# Patient Record
Sex: Female | Born: 1970
Health system: Southern US, Community
[De-identification: ages and names within clinical notes are randomized; demographics above are authoritative.]

## PROBLEM LIST (undated history)

## (undated) ENCOUNTER — Emergency Department (HOSPITAL_COMMUNITY): Admission: EM | Payer: BC Managed Care – PPO | Source: Home / Self Care

## (undated) DIAGNOSIS — A4902 Methicillin resistant Staphylococcus aureus infection, unspecified site: Secondary | ICD-10-CM

## (undated) DIAGNOSIS — I517 Cardiomegaly: Secondary | ICD-10-CM

## (undated) DIAGNOSIS — J45909 Unspecified asthma, uncomplicated: Secondary | ICD-10-CM

## (undated) HISTORY — DX: Methicillin resistant Staphylococcus aureus infection, unspecified site: A49.02

## (undated) HISTORY — DX: Cardiomegaly: I51.7

---

## 2012-04-11 HISTORY — PX: ARTHROSCOPIC REPAIR ACL: SUR80

## 2013-08-09 ENCOUNTER — Other Ambulatory Visit: Payer: Self-pay | Admitting: Obstetrics and Gynecology

## 2013-08-09 ENCOUNTER — Ambulatory Visit (INDEPENDENT_AMBULATORY_CARE_PROVIDER_SITE_OTHER): Payer: BC Managed Care – PPO | Admitting: Obstetrics and Gynecology

## 2013-08-09 ENCOUNTER — Encounter: Payer: Self-pay | Admitting: Obstetrics and Gynecology

## 2013-08-09 VITALS — BP 110/60 | HR 64 | Ht 67.0 in | Wt 147.0 lb

## 2013-08-09 DIAGNOSIS — Z01419 Encounter for gynecological examination (general) (routine) without abnormal findings: Secondary | ICD-10-CM

## 2013-08-09 DIAGNOSIS — Z0000 Encounter for general adult medical examination without abnormal findings: Secondary | ICD-10-CM

## 2013-08-09 DIAGNOSIS — L680 Hirsutism: Secondary | ICD-10-CM

## 2013-08-09 LAB — COMPREHENSIVE METABOLIC PANEL
AST: 21 U/L (ref 0–37)
Albumin: 4.4 g/dL (ref 3.5–5.2)
Alkaline Phosphatase: 46 U/L (ref 39–117)
BUN: 12 mg/dL (ref 6–23)
Calcium: 9.4 mg/dL (ref 8.4–10.5)
Chloride: 103 mEq/L (ref 96–112)
Glucose, Bld: 84 mg/dL (ref 70–99)
Potassium: 4.4 mEq/L (ref 3.5–5.3)
Sodium: 140 mEq/L (ref 135–145)
Total Bilirubin: 0.7 mg/dL (ref 0.3–1.2)

## 2013-08-09 LAB — CBC
HCT: 42.1 % (ref 36.0–46.0)
MCH: 29.4 pg (ref 26.0–34.0)
MCHC: 34.7 g/dL (ref 30.0–36.0)
RDW: 13.8 % (ref 11.5–15.5)

## 2013-08-09 LAB — POCT URINALYSIS DIPSTICK
Bilirubin, UA: NEGATIVE
Blood, UA: NEGATIVE
Glucose, UA: NEGATIVE
Ketones, UA: NEGATIVE
pH, UA: 5

## 2013-08-09 LAB — LIPID PANEL
Cholesterol: 155 mg/dL (ref 0–200)
HDL: 49 mg/dL (ref >39)
LDL Cholesterol: 88 mg/dL (ref 0–99)
Triglycerides: 92 mg/dL (ref <150)
VLDL: 18 mg/dL (ref 0–40)
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
## 2013-08-09 NOTE — Patient Instructions (Addendum)

## 2013-08-10 LAB — TSH: TSH: 3.196 u[IU]/mL (ref 0.350–4.500)

## 2013-08-11 LAB — PROLACTIN: Prolactin: 7.7 ng/mL

## 2013-08-11 LAB — DHEA-SULFATE: DHEA-SO4: 144 ug/dL (ref 35–430)

## 2013-08-11 LAB — TESTOSTERONE: Testosterone: 49 ng/dL (ref 10–70)

## 2013-08-13 ENCOUNTER — Encounter: Payer: Self-pay | Admitting: Obstetrics and Gynecology

## 2013-09-16 ENCOUNTER — Other Ambulatory Visit: Payer: Self-pay

## 2013-10-27 ENCOUNTER — Telehealth: Payer: Self-pay | Admitting: Obstetrics and Gynecology

## 2013-10-27 DIAGNOSIS — Z30432 Encounter for removal of intrauterine contraceptive device: Secondary | ICD-10-CM

## 2013-10-27 NOTE — Telephone Encounter (Signed)
Pt wants to schedule an appointment to have her Mirena removed and also wants to discuss her mammogram with the nurse.

## 2013-10-27 NOTE — Telephone Encounter (Signed)
Returned call. States she did not have any questions for me r/t Mammogram, but only wanted appointment to see Dr. Edward Jolly for IUD removal. IUD removal scheduled for 12/13/13. Went over many dates, patient has limited availability, this is first date that worked for her.  IUD ordered, sent for precert.  Routing to provider for final review. Patient agreeable to disposition. Will close encounter

## 2013-12-13 ENCOUNTER — Encounter: Payer: Self-pay | Admitting: Obstetrics and Gynecology

## 2013-12-13 ENCOUNTER — Ambulatory Visit (INDEPENDENT_AMBULATORY_CARE_PROVIDER_SITE_OTHER): Payer: BC Managed Care – PPO | Admitting: Obstetrics and Gynecology

## 2013-12-13 VITALS — BP 109/71 | HR 71 | Resp 16 | Wt 142.0 lb

## 2013-12-13 DIAGNOSIS — Z30432 Encounter for removal of intrauterine contraceptive device: Secondary | ICD-10-CM

## 2013-12-13 DIAGNOSIS — L680 Hirsutism: Secondary | ICD-10-CM

## 2013-12-13 DIAGNOSIS — N6459 Other signs and symptoms in breast: Secondary | ICD-10-CM

## 2013-12-13 DIAGNOSIS — R922 Inconclusive mammogram: Secondary | ICD-10-CM

## 2013-12-13 NOTE — Progress Notes (Signed)
Patient ID: Pamela Berger, female   DOB: 1970/12/03, 43 y.o.   MRN: [id number]  Subjective  Patient is here for 4 issues. 1) Mirena IUD removal.  Not sexually active due to husband's health.   IUD expired.  May consider another Mirena IUD for the future for control of menses if needed. 2) Discussion of dense breast tissue. 3) New diagnosis of mother with stage three renal disease.  Maternal grandmother also had renal disease and diabetes.       Patient wants evaluation.  4) Will need 17 OHP drawn.  Had evaluation for hirsutism and all labs were negative.  Did not have 17 OHP drawn.   Objective  Procedure - IUD removal Verbal consent. Speculum placed in vagina.  Dressing forceps used to grasp strings.  IUD removed in entirety and without difficulty. No complications.  Minimal EBL.  Mammogram report review [facility] 1- heterogeneously dense breast tissue  Chart review of annual exam and labs - 08/09/13 BUN - 12 Cr - 0.77 Urine dip - no protein  Assessment  Successful Mirena IUD removal. No evidence of renal disease. Normal mammogram Hirsutism.    Plan  Monitor menses off Mirena IUD. Do yearly BMP and urine check. Patient will inquire regarding mother's renal disease diagnosis. I discussed benefits/risks of 3D mammograms. Check 17 OHP now.  15 minutes face to face time of which over 50% was spent in counseling.

## 2013-12-17 LAB — 17-HYDROXYPROGESTERONE: 17-OH-PROGESTERONE, LC/MS/MS: 26 ng/dL

## 2014-07-06 ENCOUNTER — Encounter: Payer: Self-pay | Admitting: Obstetrics and Gynecology

## 2014-08-10 ENCOUNTER — Ambulatory Visit: Payer: BC Managed Care – PPO | Admitting: Obstetrics and Gynecology

## 2014-08-10 ENCOUNTER — Encounter: Payer: Self-pay | Admitting: Obstetrics and Gynecology

## 2014-08-10 ENCOUNTER — Ambulatory Visit (INDEPENDENT_AMBULATORY_CARE_PROVIDER_SITE_OTHER): Payer: BC Managed Care – PPO | Admitting: Obstetrics and Gynecology

## 2014-08-10 VITALS — BP 120/76 | HR 60 | Resp 20 | Ht 67.0 in | Wt 144.4 lb

## 2014-08-10 DIAGNOSIS — Z01419 Encounter for gynecological examination (general) (routine) without abnormal findings: Secondary | ICD-10-CM

## 2014-08-10 DIAGNOSIS — Z0000 Encounter for general adult medical examination without abnormal findings: Secondary | ICD-10-CM

## 2014-08-10 LAB — CBC
HCT: 41.5 % (ref 36.0–46.0)
HEMOGLOBIN: 14.1 g/dL (ref 12.0–15.0)
MCH: 28.3 pg (ref 26.0–34.0)
MCHC: 34 g/dL (ref 30.0–36.0)
MCV: 83.3 fL (ref 78.0–100.0)
Platelets: 274 10*3/uL (ref 150–400)
RBC: 4.98 MIL/uL (ref 3.87–5.11)
RDW: 14 % (ref 11.5–15.5)
WBC: 6.1 10*3/uL (ref 4.0–10.5)

## 2014-08-10 LAB — POCT URINALYSIS DIPSTICK
BILIRUBIN UA: NEGATIVE
Blood, UA: NEGATIVE
GLUCOSE UA: NEGATIVE
Ketones, UA: NEGATIVE
Leukocytes, UA: NEGATIVE
NITRITE UA: NEGATIVE
Protein, UA: NEGATIVE
UROBILINOGEN UA: NEGATIVE
pH, UA: 5

## 2014-08-10 LAB — TSH: TSH: 2.363 u[IU]/mL (ref 0.350–4.500)

## 2014-08-10 LAB — COMPREHENSIVE METABOLIC PANEL
ALK PHOS: 48 U/L (ref 39–117)
ALT: 16 U/L (ref 0–35)
AST: 19 U/L (ref 0–37)
Albumin: 4.2 g/dL (ref 3.5–5.2)
BUN: 13 mg/dL (ref 6–23)
CO2: 24 mEq/L (ref 19–32)
Calcium: 9.4 mg/dL (ref 8.4–10.5)
Chloride: 104 mEq/L (ref 96–112)
Creat: 0.78 mg/dL (ref 0.50–1.10)
GLUCOSE: 77 mg/dL (ref 70–99)
Potassium: 4.5 mEq/L (ref 3.5–5.3)
Sodium: 141 mEq/L (ref 135–145)
Total Bilirubin: 0.5 mg/dL (ref 0.2–1.2)
Total Protein: 6.9 g/dL (ref 6.0–8.3)

## 2014-08-10 LAB — HEMOGLOBIN, FINGERSTICK: HEMOGLOBIN, FINGERSTICK: 14.6 g/dL (ref 12.0–16.0)

## 2014-08-10 LAB — LIPID PANEL
CHOLESTEROL: 164 mg/dL (ref 0–200)
HDL: 56 mg/dL (ref >39)
LDL Cholesterol: 97 mg/dL (ref 0–99)
Total CHOL/HDL Ratio: 2.9 Ratio
Triglycerides: 57 mg/dL (ref <150)
VLDL: 11 mg/dL (ref 0–40)
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
## 2014-08-10 NOTE — Patient Instructions (Signed)

## 2014-09-12 ENCOUNTER — Encounter: Payer: Self-pay | Admitting: Obstetrics and Gynecology

## 2015-08-18 ENCOUNTER — Ambulatory Visit: Payer: BC Managed Care – PPO | Admitting: Obstetrics and Gynecology

## 2015-08-18 ENCOUNTER — Telehealth: Payer: Self-pay | Admitting: Obstetrics and Gynecology

## 2015-08-18 NOTE — Telephone Encounter (Signed)
Patient did not keep AEX appointment today, recall entered, letter mailed to patient.

## 2015-08-18 NOTE — Telephone Encounter (Signed)
Thank you for the update!

## 2016-03-27 ENCOUNTER — Encounter: Payer: Self-pay | Admitting: Obstetrics and Gynecology

## 2019-12-27 DIAGNOSIS — R6884 Jaw pain: Secondary | ICD-10-CM | POA: Diagnosis not present

## 2019-12-27 DIAGNOSIS — M545 Low back pain: Secondary | ICD-10-CM | POA: Diagnosis not present

## 2019-12-27 DIAGNOSIS — R030 Elevated blood-pressure reading, without diagnosis of hypertension: Secondary | ICD-10-CM | POA: Diagnosis not present

## 2019-12-27 DIAGNOSIS — I517 Cardiomegaly: Secondary | ICD-10-CM | POA: Diagnosis not present

## 2019-12-27 DIAGNOSIS — Z13220 Encounter for screening for lipoid disorders: Secondary | ICD-10-CM | POA: Diagnosis not present

## 2019-12-27 DIAGNOSIS — Z131 Encounter for screening for diabetes mellitus: Secondary | ICD-10-CM | POA: Diagnosis not present

## 2019-12-30 ENCOUNTER — Encounter: Payer: Self-pay | Admitting: Cardiology

## 2019-12-30 ENCOUNTER — Ambulatory Visit: Payer: Self-pay | Admitting: Cardiology

## 2019-12-31 ENCOUNTER — Encounter: Payer: Self-pay | Admitting: Cardiology

## 2019-12-31 ENCOUNTER — Ambulatory Visit (INDEPENDENT_AMBULATORY_CARE_PROVIDER_SITE_OTHER): Payer: BLUE CROSS/BLUE SHIELD | Admitting: Cardiology

## 2019-12-31 VITALS — BP 124/84 | HR 69 | Ht 67.0 in | Wt 144.0 lb

## 2019-12-31 DIAGNOSIS — R9431 Abnormal electrocardiogram [ECG] [EKG]: Secondary | ICD-10-CM

## 2019-12-31 DIAGNOSIS — R030 Elevated blood-pressure reading, without diagnosis of hypertension: Secondary | ICD-10-CM

## 2019-12-31 DIAGNOSIS — E782 Mixed hyperlipidemia: Secondary | ICD-10-CM | POA: Diagnosis not present
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
## 2019-12-31 NOTE — Patient Instructions (Signed)
Medication Instructions:  Your physician recommends that you continue on your current medications as directed. Please refer to the Current Medication list given to you today.  *If you need a refill on your cardiac medications before your next appointment, please call your pharmacy*  Lab Work: None  If you have labs (blood work) drawn today and your tests are completely normal, you will receive your results only by: Marland Kitchen MyChart Message (if you have MyChart) OR . A paper copy in the mail If you have any lab test that is abnormal or we need to change your treatment, we will call you to review the results.  Testing/Procedures: Your physician has requested that you have an echocardiogram. Echocardiography is a painless test that uses sound waves to create images of your heart. It provides your doctor with information about the size and shape of your heart and how well your heart's chambers and valves are working. This procedure takes approximately one hour. There are no restrictions for this procedure.    Follow-Up: At [facility], you and your health needs are our priority.  As part of our continuing mission to provide you with exceptional heart care, we have created designated Provider Care Teams.  These Care Teams include your primary Cardiologist (physician) and Advanced Practice Providers (APPs -  Physician Assistants and Nurse Practitioners) who all work together to provide you with the care you need, when you need it.  Your next appointment:   1 month(s)  The format for your next appointment:   In Person  Provider:   Thomasene Ripple, DO  Other Instructions  Blood Pressure Record Sheet To take your blood pressure, you will need a blood pressure machine. You can buy a blood pressure machine (blood pressure monitor) at your clinic, drug store, or online. When choosing one, consider:  An automatic monitor that has an arm cuff.  A cuff that wraps snugly around your upper arm. You  should be able to fit only one finger between your arm and the cuff.  A device that stores blood pressure reading results.  Do not choose a monitor that measures your blood pressure from your wrist or finger. Follow your health care provider's instructions for how to take your blood pressure. To use this form:  Get one reading in the morning (a.m.) before you take any medicines.  Get one reading in the evening (p.m.) before supper.  Take at least 2 readings with each blood pressure check. This makes sure the results are correct. Wait 1-2 minutes between measurements.  Write down the results in the spaces on this form.  Repeat this once a week, or as told by your health care provider.  Make a follow-up appointment with your health care provider to discuss the results. Blood pressure log Date: _______________________  a.m. _____________________(1st reading) _____________________(2nd reading)  p.m. _____________________(1st reading) _____________________(2nd reading) Date: _______________________  a.m. _____________________(1st reading) _____________________(2nd reading)  p.m. _____________________(1st reading) _____________________(2nd reading) Date: _______________________  a.m. _____________________(1st reading) _____________________(2nd reading)  p.m. _____________________(1st reading) _____________________(2nd reading) Date: _______________________  a.m. _____________________(1st reading) _____________________(2nd reading)  p.m. _____________________(1st reading) _____________________(2nd reading) Date: _______________________  a.m. _____________________(1st reading) _____________________(2nd reading)  p.m. _____________________(1st reading) _____________________(2nd reading) This information is not intended to replace advice given to you by your health care provider. Make sure you discuss any questions you have with your health care provider. Document Revised:  12/26/2017 Document Reviewed: 10/28/2017 Elsevier Patient Education  2020 [employer].

## 2020-01-01 DIAGNOSIS — R9431 Abnormal electrocardiogram [ECG] [EKG]: Secondary | ICD-10-CM | POA: Insufficient documentation

## 2020-01-01 DIAGNOSIS — R030 Elevated blood-pressure reading, without diagnosis of hypertension: Secondary | ICD-10-CM | POA: Insufficient documentation

## 2020-01-01 DIAGNOSIS — E782 Mixed hyperlipidemia: Secondary | ICD-10-CM | POA: Insufficient documentation

## 2020-01-07 ENCOUNTER — Encounter: Payer: Self-pay | Admitting: Cardiology

## 2020-01-07 DIAGNOSIS — R9431 Abnormal electrocardiogram [ECG] [EKG]: Secondary | ICD-10-CM | POA: Diagnosis not present

## 2020-01-10 ENCOUNTER — Other Ambulatory Visit: Payer: Self-pay | Admitting: Family

## 2020-01-10 DIAGNOSIS — Z8249 Family history of ischemic heart disease and other diseases of the circulatory system: Secondary | ICD-10-CM

## 2020-01-10 DIAGNOSIS — R030 Elevated blood-pressure reading, without diagnosis of hypertension: Secondary | ICD-10-CM

## 2020-01-18 ENCOUNTER — Other Ambulatory Visit: Payer: BLUE CROSS/BLUE SHIELD

## 2020-01-20 DIAGNOSIS — Z20828 Contact with and (suspected) exposure to other viral communicable diseases: Secondary | ICD-10-CM | POA: Diagnosis not present

## 2020-01-27 DIAGNOSIS — I517 Cardiomegaly: Secondary | ICD-10-CM | POA: Insufficient documentation

## 2020-01-27 DIAGNOSIS — A4902 Methicillin resistant Staphylococcus aureus infection, unspecified site: Secondary | ICD-10-CM | POA: Insufficient documentation

## 2020-01-28 ENCOUNTER — Other Ambulatory Visit: Payer: Self-pay

## 2020-01-28 ENCOUNTER — Ambulatory Visit (INDEPENDENT_AMBULATORY_CARE_PROVIDER_SITE_OTHER): Payer: BLUE CROSS/BLUE SHIELD | Admitting: Cardiology

## 2020-01-28 ENCOUNTER — Encounter: Payer: Self-pay | Admitting: Cardiology

## 2020-01-28 VITALS — BP 110/76 | HR 59 | Ht 67.0 in | Wt 141.0 lb

## 2020-01-28 DIAGNOSIS — R9431 Abnormal electrocardiogram [ECG] [EKG]: Secondary | ICD-10-CM | POA: Diagnosis not present

## 2020-01-28 DIAGNOSIS — E782 Mixed hyperlipidemia: Secondary | ICD-10-CM

## 2020-01-28 DIAGNOSIS — R030 Elevated blood-pressure reading, without diagnosis of hypertension: Secondary | ICD-10-CM

## 2020-01-28 NOTE — Patient Instructions (Signed)
Medication Instructions:  Your physician recommends that you continue on your current medications as directed. Please refer to the Current Medication list given to you today.  *If you need a refill on your cardiac medications before your next appointment, please call your pharmacy*   Lab Work: -None  If you have labs (blood work) drawn today and your tests are completely normal, you will receive your results only by: . MyChart Message (if you have MyChart) OR . A paper copy in the mail If you have any lab test that is abnormal or we need to change your treatment, we will call you to review the results.   Testing/Procedures: -None   Follow-Up: At [facility], you and your health needs are our priority.  As part of our continuing mission to provide you with exceptional heart care, we have created designated Provider Care Teams.  These Care Teams include your primary Cardiologist (physician) and Advanced Practice Providers (APPs -  Physician Assistants and Nurse Practitioners) who all work together to provide you with the care you need, when you need it.  We recommend signing up for the patient portal called "MyChart".  Sign up information is provided on this After Visit Summary.  MyChart is used to connect with patients for Virtual Visits (Telemedicine).  Patients are able to view lab/test results, encounter notes, upcoming appointments, etc.  Non-urgent messages can be sent to your provider as well.   To learn more about what you can do with MyChart, go to [email or web address].    Your next appointment:   Follow up as needed.  
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
## 2020-07-06 DIAGNOSIS — Z1211 Encounter for screening for malignant neoplasm of colon: Secondary | ICD-10-CM | POA: Diagnosis not present

## 2020-07-06 DIAGNOSIS — Z1231 Encounter for screening mammogram for malignant neoplasm of breast: Secondary | ICD-10-CM | POA: Diagnosis not present

## 2020-07-06 DIAGNOSIS — M79671 Pain in right foot: Secondary | ICD-10-CM | POA: Diagnosis not present

## 2020-07-06 DIAGNOSIS — Z0001 Encounter for general adult medical examination with abnormal findings: Secondary | ICD-10-CM | POA: Diagnosis not present

## 2020-07-06 DIAGNOSIS — E7800 Pure hypercholesterolemia, unspecified: Secondary | ICD-10-CM | POA: Diagnosis not present

## 2020-07-06 DIAGNOSIS — R6884 Jaw pain: Secondary | ICD-10-CM | POA: Diagnosis not present

## 2020-07-06 DIAGNOSIS — J0100 Acute maxillary sinusitis, unspecified: Secondary | ICD-10-CM | POA: Diagnosis not present

## 2020-07-06 DIAGNOSIS — Z1331 Encounter for screening for depression: Secondary | ICD-10-CM | POA: Diagnosis not present

## 2020-10-16 DIAGNOSIS — Z20822 Contact with and (suspected) exposure to covid-19: Secondary | ICD-10-CM | POA: Diagnosis not present

## 2021-05-04 DIAGNOSIS — M9903 Segmental and somatic dysfunction of lumbar region: Secondary | ICD-10-CM | POA: Diagnosis not present

## 2021-05-04 DIAGNOSIS — M9905 Segmental and somatic dysfunction of pelvic region: Secondary | ICD-10-CM | POA: Diagnosis not present

## 2021-05-04 DIAGNOSIS — M5451 Vertebrogenic low back pain: Secondary | ICD-10-CM | POA: Diagnosis not present

## 2021-05-04 DIAGNOSIS — M9902 Segmental and somatic dysfunction of thoracic region: Secondary | ICD-10-CM | POA: Diagnosis not present

## 2021-05-07 DIAGNOSIS — M9905 Segmental and somatic dysfunction of pelvic region: Secondary | ICD-10-CM | POA: Diagnosis not present

## 2021-05-07 DIAGNOSIS — M9902 Segmental and somatic dysfunction of thoracic region: Secondary | ICD-10-CM | POA: Diagnosis not present

## 2021-05-07 DIAGNOSIS — M5451 Vertebrogenic low back pain: Secondary | ICD-10-CM | POA: Diagnosis not present

## 2021-05-07 DIAGNOSIS — M9903 Segmental and somatic dysfunction of lumbar region: Secondary | ICD-10-CM | POA: Diagnosis not present

## 2021-07-09 DIAGNOSIS — Z1272 Encounter for screening for malignant neoplasm of vagina: Secondary | ICD-10-CM | POA: Diagnosis not present

## 2021-07-09 DIAGNOSIS — Z1231 Encounter for screening mammogram for malignant neoplasm of breast: Secondary | ICD-10-CM | POA: Diagnosis not present

## 2021-07-09 DIAGNOSIS — E7800 Pure hypercholesterolemia, unspecified: Secondary | ICD-10-CM | POA: Diagnosis not present

## 2021-07-09 DIAGNOSIS — Z124 Encounter for screening for malignant neoplasm of cervix: Secondary | ICD-10-CM | POA: Diagnosis not present

## 2021-07-09 DIAGNOSIS — Z0000 Encounter for general adult medical examination without abnormal findings: Secondary | ICD-10-CM | POA: Diagnosis not present

## 2021-07-09 DIAGNOSIS — Z1331 Encounter for screening for depression: Secondary | ICD-10-CM | POA: Diagnosis not present

## 2021-07-18 DIAGNOSIS — H40013 Open angle with borderline findings, low risk, bilateral: Secondary | ICD-10-CM | POA: Diagnosis not present

## 2021-07-26 DIAGNOSIS — Z1231 Encounter for screening mammogram for malignant neoplasm of breast: Secondary | ICD-10-CM | POA: Diagnosis not present

## 2021-08-23 DIAGNOSIS — R922 Inconclusive mammogram: Secondary | ICD-10-CM | POA: Diagnosis not present

## 2021-08-23 DIAGNOSIS — R928 Other abnormal and inconclusive findings on diagnostic imaging of breast: Secondary | ICD-10-CM | POA: Diagnosis not present

## 2021-08-28 ENCOUNTER — Other Ambulatory Visit: Payer: Self-pay | Admitting: Family

## 2021-08-28 DIAGNOSIS — N6489 Other specified disorders of breast: Secondary | ICD-10-CM

## 2021-09-06 ENCOUNTER — Ambulatory Visit
Admission: RE | Admit: 2021-09-06 | Discharge: 2021-09-06 | Disposition: A | Discharge status: Home / Self Care | Payer: BC Managed Care – PPO | Source: Ambulatory Visit | Attending: Family | Admitting: Family

## 2021-09-06 ENCOUNTER — Other Ambulatory Visit: Payer: Self-pay

## 2021-09-06 DIAGNOSIS — N6489 Other specified disorders of breast: Secondary | ICD-10-CM

## 2021-09-06 DIAGNOSIS — R928 Other abnormal and inconclusive findings on diagnostic imaging of breast: Secondary | ICD-10-CM | POA: Diagnosis not present

## 2021-09-06 DIAGNOSIS — N6012 Diffuse cystic mastopathy of left breast: Secondary | ICD-10-CM | POA: Diagnosis not present

## 2021-09-20 ENCOUNTER — Ambulatory Visit: Payer: Self-pay | Admitting: Surgery

## 2021-09-20 DIAGNOSIS — N6322 Unspecified lump in the left breast, upper inner quadrant: Secondary | ICD-10-CM | POA: Diagnosis not present

## 2021-09-20 DIAGNOSIS — N6320 Unspecified lump in the left breast, unspecified quadrant: Secondary | ICD-10-CM

## 2021-09-20 NOTE — H&P (View-Only) (Signed)
Subjective   Chief Complaint: Left Breast Discordant Biopsy     History of Present Illness: Pamela Berger is a 50 y.o. female who is seen today as an office consultation at the request of Dr. Leia Alf for evaluation of Left Breast Discordant Biopsy .    This is a 50 year old female in good health who presents after recent screening mammogram revealed an area of distortion in the left upper inner quadrant.  She underwent stereotactic biopsy of this area that revealed apocrine metaplasia and fibrocystic changes.  The biopsy was felt to be discordant so she is referred to [place] to discuss lumpectomy.  No previous breast problems.  No family history of breast cancer.  She denies any symptoms in her breast prior to the mammogram.  She did develop a small hematoma after the biopsy.  Review of Systems: A complete review of systems was obtained from the patient.  I have reviewed this information and discussed as appropriate with the patient.  See HPI as well for other ROS.  Review of Systems  Constitutional: Negative.   HENT: Negative.   Eyes: Negative.   Respiratory: Negative.   Cardiovascular: Negative.   Gastrointestinal: Negative.   Genitourinary: Negative.   Musculoskeletal: Negative.   Skin: Negative.   Neurological: Negative.   Endo/Heme/Allergies: Negative.   Psychiatric/Behavioral: Negative.       Medical History: Past Medical History:  Diagnosis Date   Asthma, unspecified asthma severity, unspecified whether complicated, unspecified whether persistent     Patient Active Problem List  Diagnosis   Elevated BP without diagnosis of hypertension    Past Surgical History:  Procedure Laterality Date   ACL Replacement  2016     Allergies  Allergen Reactions   Codeine Itching   Penicillins Itching    No current outpatient medications on file prior to visit.   No current facility-administered medications on file prior to visit.    Family History  Problem Relation Age of  Onset   High blood pressure (Hypertension) Mother    Hyperlipidemia (Elevated cholesterol) Mother    Diabetes Father      Social History   Tobacco Use  Smoking Status Former   Types: Cigarettes   Quit date: 2008   Years since quitting: 14.8  Smokeless Tobacco Never     Social History   Socioeconomic History   Marital status: Married  Tobacco Use   Smoking status: Former    Types: Cigarettes    Quit date: 2008    Years since quitting: 14.8   Smokeless tobacco: Never  Substance and Sexual Activity   Alcohol use: Yes    Comment: 2 drinks per week   Drug use: Never    Objective:    Vitals:   09/20/21 1350  BP: 122/74  Pulse: 87  Temp: 36.7 C (98.1 F)  SpO2: 98%  Weight: 67.5 kg (148 lb 12.8 oz)  Height: 170.2 cm (5\' 7" )    Body mass index is 23.31 kg/m.  Physical Exam   Constitutional:  WDWN in NAD, conversant, no obvious deformities; lying in bed comfortably Eyes:  Pupils equal, round; sclera anicteric; moist conjunctiva; no lid lag HENT:  Oral mucosa moist; good dentition  Neck:  No masses palpated, trachea midline; no thyromegaly Lungs:  CTA bilaterally; normal respiratory effort Breasts:  symmetric, no nipple changes; no palpable masses or lymphadenopathy on either side; small resolving hematoma in the left upper inner quadrant CV:  Regular rate and rhythm; no murmurs; extremities well-perfused with no edema Abd:  +  bowel sounds, soft, non-tender, no palpable organomegaly; no palpable hernias Musc:  Unable to assess gait; no apparent clubbing or cyanosis in extremities Lymphatic:  No palpable cervical or axillary lymphadenopathy Skin:  Warm, dry; no sign of jaundice Psychiatric - alert and oriented x 4; calm mood and affect   Labs, Imaging and Diagnostic Testing: Diagnosis Breast, left, needle core biopsy, upper inner - USUAL DUCTAL HYPERPLASIA AND FIBROCYSTIC CHANGES WITH APOCRINE METAPLASIA - NO MALIGNANCY IDENTIFIED Microscopic Comment These  results were called to [facility] on September 07, 2021. Manning Charity MD Pathologist, Electronic Signature (Case signed 09/07/2021)  CLINICAL DATA:  49 year old female presenting for stereotactic biopsy of a left breast distortion.   EXAM: LEFT BREAST STEREOTACTIC CORE NEEDLE BIOPSY   COMPARISON:  Previous exams.   FINDINGS: The patient and I discussed the procedure of stereotactic-guided biopsy including benefits and alternatives. We discussed the high likelihood of a successful procedure. We discussed the risks of the procedure including infection, bleeding, tissue injury, clip migration, and inadequate sampling. Informed written consent was given. The usual time out protocol was performed immediately prior to the procedure.   Using sterile technique and 1% Lidocaine as local anesthetic, under stereotactic guidance, a 9 gauge vacuum assisted device was used to perform core needle biopsy of distortion in the upper inner left breast using a medial approach.   Lesion quadrant: Upper inner quadrant   At the conclusion of the procedure, a coil shaped shaped tissue marker clip was deployed into the biopsy cavity. Follow-up 2-view mammogram was performed and dictated separately.   IMPRESSION: Stereotactic-guided biopsy of distortion in the upper inner left breast. No apparent complications.   Electronically Signed: By: Frederico Hamman M.D. On: 09/06/2021 11:14  CLINICAL DATA:  Post biopsy mammogram of the left breast for clip placement.   EXAM: 3D DIAGNOSTIC LEFT MAMMOGRAM POST STEREOTACTIC BIOPSY   COMPARISON:  Previous exam(s).   FINDINGS: 3D Mammographic images were obtained following stereotactic guided biopsy of distortion in the upper inner left breast. The biopsy marking clip is about 5 mm anterior to the center of the distortion.   IMPRESSION: The coil shaped biopsy marking clip is approximately 5 mm anterior to the center of the  distortion in the upper inner left breast.   Final Assessment: Post Procedure Mammograms for Marker Placement     Electronically Signed   By: Frederico Hamman M.D.   On: 09/06/2021 11:20   Assessment and Plan:  Diagnoses and all orders for this visit:  Mass of upper inner quadrant of left breast    Left radioactive seed localized lumpectomy.  The surgical procedure has been discussed with the patient.  Potential risks, benefits, alternative treatments, and expected outcomes have been explained.  All of the patient's questions at this time have been answered.  The likelihood of reaching the patient's treatment goal is good.  The patient understand the proposed surgical procedure and wishes to proceed.    Daesean Lazarz Delbert Harness, MD  09/20/2021 3:13 PM

## 2021-09-20 NOTE — H&P (Signed)
Subjective   Chief Complaint: Left Breast Discordant Biopsy     History of Present Illness: Pamela Berger is a 50 y.o. female who is seen today as an office consultation at the request of Dr. Leia Alf for evaluation of Left Breast Discordant Biopsy .    This is a 50 year old female in good health who presents after recent screening mammogram revealed an area of distortion in the left upper inner quadrant.  She underwent stereotactic biopsy of this area that revealed apocrine metaplasia and fibrocystic changes.  The biopsy was felt to be discordant so she is referred to [place] to discuss lumpectomy.  No previous breast problems.  No family history of breast cancer.  She denies any symptoms in her breast prior to the mammogram.  She did develop a small hematoma after the biopsy.  Review of Systems: A complete review of systems was obtained from the patient.  I have reviewed this information and discussed as appropriate with the patient.  See HPI as well for other ROS.  Review of Systems  Constitutional: Negative.   HENT: Negative.   Eyes: Negative.   Respiratory: Negative.   Cardiovascular: Negative.   Gastrointestinal: Negative.   Genitourinary: Negative.   Musculoskeletal: Negative.   Skin: Negative.   Neurological: Negative.   Endo/Heme/Allergies: Negative.   Psychiatric/Behavioral: Negative.       Medical History: Past Medical History:  Diagnosis Date   Asthma, unspecified asthma severity, unspecified whether complicated, unspecified whether persistent     Patient Active Problem List  Diagnosis   Elevated BP without diagnosis of hypertension    Past Surgical History:  Procedure Laterality Date   ACL Replacement  2016     Allergies  Allergen Reactions   Codeine Itching   Penicillins Itching    No current outpatient medications on file prior to visit.   No current facility-administered medications on file prior to visit.    Family History  Problem Relation Age of  Onset   High blood pressure (Hypertension) Mother    Hyperlipidemia (Elevated cholesterol) Mother    Diabetes Father      Social History   Tobacco Use  Smoking Status Former   Types: Cigarettes   Quit date: 2008   Years since quitting: 14.8  Smokeless Tobacco Never     Social History   Socioeconomic History   Marital status: Married  Tobacco Use   Smoking status: Former    Types: Cigarettes    Quit date: 2008    Years since quitting: 14.8   Smokeless tobacco: Never  Substance and Sexual Activity   Alcohol use: Yes    Comment: 2 drinks per week   Drug use: Never    Objective:    Vitals:   09/20/21 1350  BP: 122/74  Pulse: 87  Temp: 36.7 C (98.1 F)  SpO2: 98%  Weight: 67.5 kg (148 lb 12.8 oz)  Height: 170.2 cm (5\' 7" )    Body mass index is 23.31 kg/m.  Physical Exam   Constitutional:  WDWN in NAD, conversant, no obvious deformities; lying in bed comfortably Eyes:  Pupils equal, round; sclera anicteric; moist conjunctiva; no lid lag HENT:  Oral mucosa moist; good dentition  Neck:  No masses palpated, trachea midline; no thyromegaly Lungs:  CTA bilaterally; normal respiratory effort Breasts:  symmetric, no nipple changes; no palpable masses or lymphadenopathy on either side; small resolving hematoma in the left upper inner quadrant CV:  Regular rate and rhythm; no murmurs; extremities well-perfused with no edema Abd:  +  bowel sounds, soft, non-tender, no palpable organomegaly; no palpable hernias Musc:  Unable to assess gait; no apparent clubbing or cyanosis in extremities Lymphatic:  No palpable cervical or axillary lymphadenopathy Skin:  Warm, dry; no sign of jaundice Psychiatric - alert and oriented x 4; calm mood and affect   Labs, Imaging and Diagnostic Testing: Diagnosis Breast, left, needle core biopsy, upper inner - USUAL DUCTAL HYPERPLASIA AND FIBROCYSTIC CHANGES WITH APOCRINE METAPLASIA - NO MALIGNANCY IDENTIFIED Microscopic Comment These  results were called to [facility] on September 07, 2021. Manning Charity MD Pathologist, Electronic Signature (Case signed 09/07/2021)  CLINICAL DATA:  50 year old female presenting for stereotactic biopsy of a left breast distortion.   EXAM: LEFT BREAST STEREOTACTIC CORE NEEDLE BIOPSY   COMPARISON:  Previous exams.   FINDINGS: The patient and I discussed the procedure of stereotactic-guided biopsy including benefits and alternatives. We discussed the high likelihood of a successful procedure. We discussed the risks of the procedure including infection, bleeding, tissue injury, clip migration, and inadequate sampling. Informed written consent was given. The usual time out protocol was performed immediately prior to the procedure.   Using sterile technique and 1% Lidocaine as local anesthetic, under stereotactic guidance, a 9 gauge vacuum assisted device was used to perform core needle biopsy of distortion in the upper inner left breast using a medial approach.   Lesion quadrant: Upper inner quadrant   At the conclusion of the procedure, a coil shaped shaped tissue marker clip was deployed into the biopsy cavity. Follow-up 2-view mammogram was performed and dictated separately.   IMPRESSION: Stereotactic-guided biopsy of distortion in the upper inner left breast. No apparent complications.   Electronically Signed: By: Frederico Hamman M.D. On: 09/06/2021 11:14  CLINICAL DATA:  Post biopsy mammogram of the left breast for clip placement.   EXAM: 3D DIAGNOSTIC LEFT MAMMOGRAM POST STEREOTACTIC BIOPSY   COMPARISON:  Previous exam(s).   FINDINGS: 3D Mammographic images were obtained following stereotactic guided biopsy of distortion in the upper inner left breast. The biopsy marking clip is about 5 mm anterior to the center of the distortion.   IMPRESSION: The coil shaped biopsy marking clip is approximately 5 mm anterior to the center of the  distortion in the upper inner left breast.   Final Assessment: Post Procedure Mammograms for Marker Placement     Electronically Signed   By: Frederico Hamman M.D.   On: 09/06/2021 11:20   Assessment and Plan:  Diagnoses and all orders for this visit:  Mass of upper inner quadrant of left breast    Left radioactive seed localized lumpectomy.  The surgical procedure has been discussed with the patient.  Potential risks, benefits, alternative treatments, and expected outcomes have been explained.  All of the patient's questions at this time have been answered.  The likelihood of reaching the patient's treatment goal is good.  The patient understand the proposed surgical procedure and wishes to proceed.    Arial Galligan Delbert Harness, MD  09/20/2021 3:13 PM

## 2021-09-28 ENCOUNTER — Other Ambulatory Visit: Payer: Self-pay | Admitting: Surgery

## 2021-09-28 DIAGNOSIS — N6320 Unspecified lump in the left breast, unspecified quadrant: Secondary | ICD-10-CM

## 2021-10-09 ENCOUNTER — Encounter (HOSPITAL_BASED_OUTPATIENT_CLINIC_OR_DEPARTMENT_OTHER): Payer: Self-pay | Admitting: Surgery

## 2021-10-09 ENCOUNTER — Other Ambulatory Visit: Payer: Self-pay

## 2021-10-17 ENCOUNTER — Ambulatory Visit
Admission: RE | Admit: 2021-10-17 | Discharge: 2021-10-17 | Disposition: A | Discharge status: Home / Self Care | Payer: BC Managed Care – PPO | Source: Ambulatory Visit | Attending: Surgery | Admitting: Surgery

## 2021-10-17 DIAGNOSIS — N6320 Unspecified lump in the left breast, unspecified quadrant: Secondary | ICD-10-CM

## 2021-10-17 DIAGNOSIS — R928 Other abnormal and inconclusive findings on diagnostic imaging of breast: Secondary | ICD-10-CM | POA: Diagnosis not present

## 2021-10-17 MED ORDER — CHLORHEXIDINE GLUCONATE CLOTH 2 % EX PADS: 6.0000 | MEDICATED_PAD | Freq: Once | CUTANEOUS | Status: DC

## 2021-10-17 NOTE — Progress Notes (Signed)

## 2021-10-18 ENCOUNTER — Encounter (HOSPITAL_BASED_OUTPATIENT_CLINIC_OR_DEPARTMENT_OTHER): Payer: Self-pay | Admitting: Surgery

## 2021-10-18 ENCOUNTER — Ambulatory Visit (HOSPITAL_BASED_OUTPATIENT_CLINIC_OR_DEPARTMENT_OTHER)
Admission: RE | Admit: 2021-10-18 | Discharge: 2021-10-18 | Disposition: A | Discharge status: Home / Self Care | Payer: BC Managed Care – PPO | Source: Ambulatory Visit | Attending: Surgery | Admitting: Surgery

## 2021-10-18 ENCOUNTER — Ambulatory Visit (HOSPITAL_BASED_OUTPATIENT_CLINIC_OR_DEPARTMENT_OTHER): Payer: BC Managed Care – PPO | Admitting: Anesthesiology

## 2021-10-18 ENCOUNTER — Other Ambulatory Visit: Payer: Self-pay

## 2021-10-18 ENCOUNTER — Encounter (HOSPITAL_BASED_OUTPATIENT_CLINIC_OR_DEPARTMENT_OTHER)
Admission: RE | Disposition: A | Discharge status: Home / Self Care | Payer: Self-pay | Source: Ambulatory Visit | Attending: Surgery

## 2021-10-18 ENCOUNTER — Ambulatory Visit
Admission: RE | Admit: 2021-10-18 | Discharge: 2021-10-18 | Disposition: A | Discharge status: Home / Self Care | Payer: BC Managed Care – PPO | Source: Ambulatory Visit | Attending: Surgery | Admitting: Surgery

## 2021-10-18 DIAGNOSIS — N6489 Other specified disorders of breast: Secondary | ICD-10-CM | POA: Diagnosis not present

## 2021-10-18 DIAGNOSIS — N62 Hypertrophy of breast: Secondary | ICD-10-CM | POA: Diagnosis not present

## 2021-10-18 DIAGNOSIS — Z87891 Personal history of nicotine dependence: Secondary | ICD-10-CM | POA: Insufficient documentation

## 2021-10-18 DIAGNOSIS — R928 Other abnormal and inconclusive findings on diagnostic imaging of breast: Secondary | ICD-10-CM | POA: Diagnosis not present

## 2021-10-18 DIAGNOSIS — N63 Unspecified lump in unspecified breast: Secondary | ICD-10-CM | POA: Diagnosis not present

## 2021-10-18 DIAGNOSIS — N6320 Unspecified lump in the left breast, unspecified quadrant: Secondary | ICD-10-CM

## 2021-10-18 DIAGNOSIS — N6012 Diffuse cystic mastopathy of left breast: Secondary | ICD-10-CM | POA: Diagnosis not present

## 2021-10-18 DIAGNOSIS — N6082 Other benign mammary dysplasias of left breast: Secondary | ICD-10-CM | POA: Diagnosis not present

## 2021-10-18 HISTORY — DX: Unspecified asthma, uncomplicated: J45.909

## 2021-10-18 HISTORY — PX: BREAST LUMPECTOMY WITH RADIOACTIVE SEED LOCALIZATION: SHX6424

## 2021-10-18 LAB — POCT PREGNANCY, URINE: Preg Test, Ur: NEGATIVE

## 2021-10-18 SURGERY — BREAST LUMPECTOMY WITH RADIOACTIVE SEED LOCALIZATION
Anesthesia: General | Site: Breast | Laterality: Left

## 2021-10-18 MED ORDER — OXYCODONE HCL 5 MG PO TABS: 5.0000 mg | ORAL_TABLET | Freq: Once | ORAL | Status: DC | PRN

## 2021-10-18 MED ORDER — LIDOCAINE 2% (20 MG/ML) 5 ML SYRINGE
INTRAMUSCULAR | Status: DC | PRN
Start: 2021-10-18 — End: 2021-10-18
  Administered 2021-10-18: 100 mg via INTRAVENOUS

## 2021-10-18 MED ORDER — PROPOFOL 10 MG/ML IV BOLUS
INTRAVENOUS | Status: AC
  Filled 2021-10-18: qty 20

## 2021-10-18 MED ORDER — CEFAZOLIN SODIUM-DEXTROSE 2-4 GM/100ML-% IV SOLN
INTRAVENOUS | Status: AC
  Filled 2021-10-18: qty 100

## 2021-10-18 MED ORDER — CEFAZOLIN SODIUM-DEXTROSE 2-4 GM/100ML-% IV SOLN
2.0000 g | INTRAVENOUS | Status: AC
  Administered 2021-10-18: 2 g via INTRAVENOUS

## 2021-10-18 MED ORDER — ONDANSETRON HCL 4 MG/2ML IJ SOLN
INTRAMUSCULAR | Status: AC
  Filled 2021-10-18: qty 2

## 2021-10-18 MED ORDER — METOCLOPRAMIDE HCL 5 MG/ML IJ SOLN
10.0000 mg | Freq: Once | INTRAMUSCULAR | Status: AC
  Administered 2021-10-18: 10 mg via INTRAVENOUS

## 2021-10-18 MED ORDER — ACETAMINOPHEN 500 MG PO TABS
1000.0000 mg | ORAL_TABLET | ORAL | Status: AC
  Administered 2021-10-18: 1000 mg via ORAL

## 2021-10-18 MED ORDER — BUPIVACAINE-EPINEPHRINE 0.25% -1:200000 IJ SOLN
INTRAMUSCULAR | Status: DC | PRN
  Administered 2021-10-18: 10 mL

## 2021-10-18 MED ORDER — PHENYLEPHRINE 40 MCG/ML (10ML) SYRINGE FOR IV PUSH (FOR BLOOD PRESSURE SUPPORT)
PREFILLED_SYRINGE | INTRAVENOUS | Status: AC
  Filled 2021-10-18: qty 10

## 2021-10-18 MED ORDER — FENTANYL CITRATE (PF) 100 MCG/2ML IJ SOLN: 25.0000 ug | INTRAMUSCULAR | Status: DC | PRN

## 2021-10-18 MED ORDER — MIDAZOLAM HCL 5 MG/5ML IJ SOLN
INTRAMUSCULAR | Status: DC | PRN
  Administered 2021-10-18: 2 mg via INTRAVENOUS

## 2021-10-18 MED ORDER — DEXAMETHASONE SODIUM PHOSPHATE 10 MG/ML IJ SOLN
INTRAMUSCULAR | Status: AC
  Filled 2021-10-18: qty 1

## 2021-10-18 MED ORDER — FENTANYL CITRATE (PF) 100 MCG/2ML IJ SOLN
INTRAMUSCULAR | Status: AC
  Filled 2021-10-18: qty 2

## 2021-10-18 MED ORDER — ACETAMINOPHEN 500 MG PO TABS
ORAL_TABLET | ORAL | Status: AC
  Filled 2021-10-18: qty 2

## 2021-10-18 MED ORDER — FENTANYL CITRATE (PF) 100 MCG/2ML IJ SOLN
INTRAMUSCULAR | Status: DC | PRN
  Administered 2021-10-18: 25 ug via INTRAVENOUS
  Administered 2021-10-18: 100 ug via INTRAVENOUS

## 2021-10-18 MED ORDER — LIDOCAINE 2% (20 MG/ML) 5 ML SYRINGE
INTRAMUSCULAR | Status: AC
  Filled 2021-10-18: qty 5

## 2021-10-18 MED ORDER — PROPOFOL 10 MG/ML IV BOLUS
INTRAVENOUS | Status: DC | PRN
  Administered 2021-10-18: 150 mg via INTRAVENOUS
  Administered 2021-10-18: 50 mg via INTRAVENOUS

## 2021-10-18 MED ORDER — LACTATED RINGERS IV SOLN: INTRAVENOUS | Status: DC

## 2021-10-18 MED ORDER — PHENYLEPHRINE HCL (PRESSORS) 10 MG/ML IV SOLN
INTRAVENOUS | Status: DC | PRN
  Administered 2021-10-18 (×3): 80 ug via INTRAVENOUS

## 2021-10-18 MED ORDER — ONDANSETRON HCL 4 MG/2ML IJ SOLN: 4.0000 mg | Freq: Once | INTRAMUSCULAR | Status: DC | PRN

## 2021-10-18 MED ORDER — ONDANSETRON HCL 4 MG/2ML IJ SOLN
INTRAMUSCULAR | Status: DC | PRN
  Administered 2021-10-18: 4 mg via INTRAVENOUS

## 2021-10-18 MED ORDER — MIDAZOLAM HCL 2 MG/2ML IJ SOLN
INTRAMUSCULAR | Status: AC
  Filled 2021-10-18: qty 2

## 2021-10-18 MED ORDER — MEPERIDINE HCL 25 MG/ML IJ SOLN
6.2500 mg | INTRAMUSCULAR | Status: DC | PRN
Start: 2021-10-18 — End: 2021-10-18

## 2021-10-18 MED ORDER — DEXAMETHASONE SODIUM PHOSPHATE 4 MG/ML IJ SOLN
INTRAMUSCULAR | Status: DC | PRN
  Administered 2021-10-18: 8 mg via INTRAVENOUS

## 2021-10-18 MED ORDER — ACETAMINOPHEN 160 MG/5ML PO SOLN: 325.0000 mg | ORAL | Status: DC | PRN

## 2021-10-18 MED ORDER — ACETAMINOPHEN 325 MG PO TABS: 325.0000 mg | ORAL_TABLET | ORAL | Status: DC | PRN

## 2021-10-18 MED ORDER — OXYCODONE HCL 5 MG/5ML PO SOLN: 5.0000 mg | Freq: Once | ORAL | Status: DC | PRN

## 2021-10-18 MED ORDER — METOCLOPRAMIDE HCL 5 MG/ML IJ SOLN
INTRAMUSCULAR | Status: AC
  Filled 2021-10-18: qty 2

## 2021-10-18 SURGICAL SUPPLY — 44 items
APL PRP STRL LF DISP 70% ISPRP (MISCELLANEOUS) ×1
APL SKNCLS STERI-STRIP NONHPOA (GAUZE/BANDAGES/DRESSINGS) ×1
APPLIER CLIP 9.375 MED OPEN (MISCELLANEOUS) ×2
APR CLP MED 9.3 20 MLT OPN (MISCELLANEOUS) ×1
BENZOIN TINCTURE PRP APPL 2/3 (GAUZE/BANDAGES/DRESSINGS) ×2 IMPLANT
BLADE HEX COATED 2.75 (ELECTRODE) ×2 IMPLANT
BLADE SURG 15 STRL LF DISP TIS (BLADE) ×1 IMPLANT
BLADE SURG 15 STRL SS (BLADE) ×2
CANISTER SUC SOCK COL 7IN (MISCELLANEOUS) IMPLANT
CANISTER SUCT 1200ML W/VALVE (MISCELLANEOUS) IMPLANT
CHLORAPREP W/TINT 26 (MISCELLANEOUS) ×2 IMPLANT
CLIP APPLIE 9.375 MED OPEN (MISCELLANEOUS) ×1 IMPLANT
COVER BACK TABLE 60X90IN (DRAPES) ×2 IMPLANT
COVER MAYO STAND STRL (DRAPES) ×2 IMPLANT
COVER PROBE W GEL 5X96 (DRAPES) ×2 IMPLANT
DRAPE LAPAROTOMY 100X72 PEDS (DRAPES) ×2 IMPLANT
DRAPE UTILITY XL STRL (DRAPES) ×2 IMPLANT
DRSG TEGADERM 4X4.75 (GAUZE/BANDAGES/DRESSINGS) ×2 IMPLANT
ELECT REM PT RETURN 9FT ADLT (ELECTROSURGICAL) ×2
ELECTRODE REM PT RTRN 9FT ADLT (ELECTROSURGICAL) ×1 IMPLANT
GAUZE SPONGE 4X4 12PLY STRL LF (GAUZE/BANDAGES/DRESSINGS) ×2 IMPLANT
GLOVE SURG ENC MOIS LTX SZ7 (GLOVE) ×2 IMPLANT
GLOVE SURG UNDER POLY LF SZ7 (GLOVE) ×2 IMPLANT
GLOVE SURG UNDER POLY LF SZ7.5 (GLOVE) ×2 IMPLANT
GOWN STRL REUS W/ TWL LRG LVL3 (GOWN DISPOSABLE) ×2 IMPLANT
GOWN STRL REUS W/TWL LRG LVL3 (GOWN DISPOSABLE) ×4
KIT MARKER MARGIN INK (KITS) ×2 IMPLANT
NEEDLE HYPO 25X1 1.5 SAFETY (NEEDLE) ×2 IMPLANT
NS IRRIG 1000ML POUR BTL (IV SOLUTION) ×2 IMPLANT
PACK BASIN DAY SURGERY FS (CUSTOM PROCEDURE TRAY) ×2 IMPLANT
PENCIL SMOKE EVACUATOR (MISCELLANEOUS) ×2 IMPLANT
SLEEVE SCD COMPRESS KNEE MED (STOCKING) ×2 IMPLANT
SPONGE T-LAP 4X18 ~~LOC~~+RFID (SPONGE) ×2 IMPLANT
STRIP CLOSURE SKIN 1/2X4 (GAUZE/BANDAGES/DRESSINGS) ×2 IMPLANT
SUT MON AB 4-0 PC3 18 (SUTURE) ×2 IMPLANT
SUT SILK 2 0 SH (SUTURE) IMPLANT
SUT VIC AB 3-0 SH 27 (SUTURE) ×2
SUT VIC AB 3-0 SH 27X BRD (SUTURE) ×1 IMPLANT
SYR BULB EAR ULCER 3OZ GRN STR (SYRINGE) IMPLANT
SYR CONTROL 10ML LL (SYRINGE) ×2 IMPLANT
TOWEL GREEN STERILE FF (TOWEL DISPOSABLE) ×2 IMPLANT
TRAY FAXITRON CT DISP (TRAY / TRAY PROCEDURE) ×2 IMPLANT
TUBE CONNECTING 20X1/4 (TUBING) IMPLANT
YANKAUER SUCT BULB TIP NO VENT (SUCTIONS) IMPLANT

## 2021-10-18 NOTE — Op Note (Signed)
Pre-op Diagnosis:  Left breast mass Post-op Diagnosis: same Procedure:  Left radioactive seed localized lumpectomy Surgeon:  Jotham Ahn K. Anesthesia:  GEN - LMA Indications:  This is a 50 year old female in good health who presents after recent screening mammogram revealed an area of distortion in the left upper inner quadrant.  She underwent stereotactic biopsy of this area that revealed apocrine metaplasia and fibrocystic changes.  The biopsy was felt to be discordant so she is referred to [place] to discuss lumpectomy.  No previous breast problems.  No family history of breast cancer.  She denies any symptoms in her breast prior to the mammogram.   Description of procedure: The patient is brought to the operating room placed in supine position on the operating room table. After an adequate level of general anesthesia was obtained, her left breast was prepped with ChloraPrep and draped in sterile fashion. A timeout was taken to ensure the proper patient and proper procedure. We interrogated the breast with the neoprobe. We made a circumareolar incision around the upper medial side of the nipple after infiltrating with 0.25% Marcaine. Dissection was carried down in the breast tissue with cautery. We used the neoprobe to guide [place] towards the radioactive seed. We excised an area of tissue around the radioactive seed 1.5 cm in diameter. The specimen was removed and was oriented with a paint kit. Specimen mammogram showed the radioactive seed as well as the biopsy clip within the specimen. This was sent for pathologic examination. There is no residual radioactivity within the biopsy cavity. We inspected carefully for hemostasis. The wound was thoroughly irrigated. The wound was closed with a deep layer of 3-0 Vicryl and a subcuticular layer of 4-0 Monocryl. Benzoin Steri-Strips were applied. The patient was then extubated and brought to the recovery room in stable condition. All sponge, instrument, and needle  counts are correct.  Imogene Burn. Georgette Dover, MD, [facility]  General/ Trauma Surgery  10/18/2021 3:09 PM

## 2021-10-18 NOTE — Anesthesia Procedure Notes (Signed)
Procedure Name: LMA Insertion Date/Time: 10/18/2021 2:26 PM Performed by: Alford Highland, CRNA Pre-anesthesia Checklist: Patient identified, Emergency Drugs available, Suction available and Patient being monitored Patient Re-evaluated:Patient Re-evaluated prior to induction Oxygen Delivery Method: Circle System Utilized Preoxygenation: Pre-oxygenation with 100% oxygen Induction Type: IV induction Ventilation: Mask ventilation without difficulty LMA: LMA inserted LMA Size: 4.0 Number of attempts: 1 Airway Equipment and Method: Bite block Placement Confirmation: positive ETCO2 Tube secured with: Tape Dental Injury: Teeth and Oropharynx as per pre-operative assessment

## 2021-10-18 NOTE — Transfer of Care (Signed)
Immediate Anesthesia Transfer of Care Note  Patient: Pamela Berger  Procedure(s) Performed: LEFT BREAST LUMPECTOMY WITH RADIOACTIVE SEED LOCALIZATION (Left: Breast)  Patient Location: PACU  Anesthesia Type:General  Level of Consciousness: drowsy  Airway & Oxygen Therapy: Patient Spontanous Breathing and Patient connected to face mask oxygen  Post-op Assessment: Report given to RN and Post -op Vital signs reviewed and stable  Post vital signs: Reviewed and stable  Last Vitals:  Vitals Value Taken Time  BP    Temp    Pulse 71 10/18/21 1512  Resp 12 10/18/21 1512  SpO2 98 % 10/18/21 1512  Vitals shown include unvalidated device data.  Last Pain:  Vitals:   10/18/21 1318  TempSrc: Oral  PainSc: 0-No pain      Patients Stated Pain Goal: 4 (10/18/21 1318)  Complications: No notable events documented.

## 2021-10-18 NOTE — Discharge Instructions (Addendum)
[facility] Office Phone Number [phone]  BREAST BIOPSY/ PARTIAL MASTECTOMY: POST OP INSTRUCTIONS  Always review your discharge instruction sheet given to you by the facility where your surgery was performed.  IF YOU HAVE DISABILITY OR FAMILY LEAVE FORMS, YOU MUST BRING THEM TO THE OFFICE FOR PROCESSING.  DO NOT GIVE THEM TO YOUR DOCTOR.  A prescription for pain medication may be given to you upon discharge.  Take your pain medication as prescribed, if needed.  If narcotic pain medicine is not needed, then you may take acetaminophen (Tylenol) or ibuprofen (Advil) as needed. Take your usually prescribed medications unless otherwise directed If you need a refill on your pain medication, please contact your pharmacy.  They will contact our office to request authorization.  Prescriptions will not be filled after 5pm or on week-ends. You should eat very light the first 24 hours after surgery, such as soup, crackers, pudding, etc.  Resume your normal diet the day after surgery. Most patients will experience some swelling and bruising in the breast.  Ice packs and a good support bra will help.  Swelling and bruising can take several days to resolve.  It is common to experience some constipation if taking pain medication after surgery.  Increasing fluid intake and taking a stool softener will usually help or prevent this problem from occurring.  A mild laxative (Milk of Magnesia or Miralax) should be taken according to package directions if there are no bowel movements after 48 hours. Unless discharge instructions indicate otherwise, you may remove your bandages 24-48 hours after surgery, and you may shower at that time.  You may have steri-strips (small skin tapes) in place directly over the incision.  These strips should be left on the skin for 7-10 days.  If your surgeon used skin glue on the incision, you may shower in 24 hours.  The glue will flake off over the next 2-3 weeks.  Any  sutures or staples will be removed at the office during your follow-up visit. ACTIVITIES:  You may resume regular daily activities (gradually increasing) beginning the next day.  Wearing a good support bra or sports bra minimizes pain and swelling.  You may have sexual intercourse when it is comfortable. You may drive when you no longer are taking prescription pain medication, you can comfortably wear a seatbelt, and you can safely maneuver your car and apply brakes. RETURN TO WORK:  ______________________________________________________________________________________ Bonita Quin should see your doctor in the office for a follow-up appointment approximately two weeks after your surgery.  Your doctor's nurse will typically make your follow-up appointment when she calls you with your pathology report.  Expect your pathology report 2-3 business days after your surgery.  You may call to check if you do not hear from [place] after three days. OTHER INSTRUCTIONS: _______________________________________________________________________________________________ _____________________________________________________________________________________________________________________________________ _____________________________________________________________________________________________________________________________________ _____________________________________________________________________________________________________________________________________  WHEN TO CALL YOUR DOCTOR: Fever over 101.0 Nausea and/or vomiting. Extreme swelling or bruising. Continued bleeding from incision. Increased pain, redness, or drainage from the incision.  The clinic staff is available to answer your questions during regular business hours.  Please don't hesitate to call and ask to speak to one of the nurses for clinical concerns.  If you have a medical emergency, go to the nearest emergency room or call 911.  A surgeon from [facility] is always on call at the hospital.  For further questions, please visit [email or web address]    No Tylenol before 7:30pm if needed.   Post Anesthesia Home Care Instructions  Activity:  Get plenty of rest for the remainder of the day. A responsible individual must stay with you for 24 hours following the procedure.  For the next 24 hours, DO NOT: -Drive a car -Paediatric nurse -Drink alcoholic beverages -Take any medication unless instructed by your physician -Make any legal decisions or sign important papers.  Meals: Start with liquid foods such as gelatin or soup. Progress to regular foods as tolerated. Avoid greasy, spicy, heavy foods. If nausea and/or vomiting occur, drink only clear liquids until the nausea and/or vomiting subsides. Call your physician if vomiting continues.  Special Instructions/Symptoms: Your throat may feel dry or sore from the anesthesia or the breathing tube placed in your throat during surgery. If this causes discomfort, gargle with warm salt water. The discomfort should disappear within 24 hours.  If you had a scopolamine patch placed behind your ear for the management of post- operative nausea and/or vomiting:  1. The medication in the patch is effective for 72 hours, after which it should be removed.  Wrap patch in a tissue and discard in the trash. Wash hands thoroughly with soap and water. 2. You may remove the patch earlier than 72 hours if you experience unpleasant side effects which may include dry mouth, dizziness or visual disturbances. 3. Avoid touching the patch. Wash your hands with soap and water after contact with the patch.

## 2021-10-18 NOTE — Anesthesia Postprocedure Evaluation (Signed)
Anesthesia Post Note  Patient: Pamela Berger  Procedure(s) Performed: LEFT BREAST LUMPECTOMY WITH RADIOACTIVE SEED LOCALIZATION (Left: Breast)     Patient location during evaluation: PACU Anesthesia Type: General Level of consciousness: awake and alert and oriented Pain management: pain level controlled Vital Signs Assessment: post-procedure vital signs reviewed and stable Respiratory status: spontaneous breathing, nonlabored ventilation and respiratory function stable Cardiovascular status: blood pressure returned to baseline and stable Postop Assessment: no apparent nausea or vomiting Anesthetic complications: no   No notable events documented.  Last Vitals:  Vitals:   10/18/21 1545 10/18/21 1600  BP: 109/62 119/67  Pulse: 70 69  Resp: 12 14  Temp:    SpO2: 96% 98%    Last Pain:  Vitals:   10/18/21 1545  TempSrc:   PainSc: Asleep                 [name]

## 2021-10-18 NOTE — Anesthesia Preprocedure Evaluation (Addendum)
Anesthesia Evaluation  Patient identified by MRN, date of birth, ID band Patient awake    Reviewed: Allergy & Precautions, H&P , NPO status , Patient's Chart, lab work & pertinent test results, reviewed documented beta blocker date and time   Airway Mallampati: I  TM Distance: >3 FB Neck ROM: full    Dental no notable dental hx. (+) Teeth Intact, Dental Advisory Given, Poor Dentition, Missing,    Pulmonary neg pulmonary ROS, former smoker,    Pulmonary exam normal breath sounds clear to auscultation       Cardiovascular Exercise Tolerance: Good negative cardio ROS   Rhythm:regular Rate:Normal     Neuro/Psych negative neurological ROS  negative psych ROS   GI/Hepatic negative GI ROS, Neg liver ROS,   Endo/Other  negative endocrine ROS  Renal/GU negative Renal ROS  negative genitourinary   Musculoskeletal   Abdominal   Peds  Hematology negative hematology ROS (+)   Anesthesia Other Findings   Reproductive/Obstetrics negative OB ROS                            Anesthesia Physical Anesthesia Plan  ASA: 2  Anesthesia Plan: General   Post-op Pain Management:    Induction: Intravenous  PONV Risk Score and Plan: 3 and Ondansetron and Dexamethasone  Airway Management Planned: LMA and Oral ETT  Additional Equipment: None  Intra-op Plan:   Post-operative Plan: Extubation in OR  Informed Consent: I have reviewed the patients History and Physical, chart, labs and discussed the procedure including the risks, benefits and alternatives for the proposed anesthesia with the patient or authorized representative who has indicated his/her understanding and acceptance.     Dental Advisory Given  Plan Discussed with: CRNA and Anesthesiologist  Anesthesia Plan Comments: ( )        Anesthesia Quick Evaluation

## 2021-10-18 NOTE — Interval H&P Note (Signed)
History and Physical Interval Note:  10/18/2021 12:37 PM  Pamela Berger  has presented today for surgery, with the diagnosis of LEFT BREAST MASS.  The various methods of treatment have been discussed with the patient and family. After consideration of risks, benefits and other options for treatment, the patient has consented to  Procedure(s): LEFT BREAST LUMPECTOMY WITH RADIOACTIVE SEED LOCALIZATION (Left) as a surgical intervention.  The patient's history has been reviewed, patient examined, no change in status, stable for surgery.  I have reviewed the patient's chart and labs.  Questions were answered to the patient's satisfaction.     [name]

## 2021-10-19 ENCOUNTER — Encounter (HOSPITAL_BASED_OUTPATIENT_CLINIC_OR_DEPARTMENT_OTHER): Payer: Self-pay | Admitting: Surgery

## 2021-10-24 LAB — SURGICAL PATHOLOGY

## 2022-01-16 ENCOUNTER — Encounter (HOSPITAL_COMMUNITY): Payer: Self-pay

## 2022-04-11 DIAGNOSIS — Z1211 Encounter for screening for malignant neoplasm of colon: Secondary | ICD-10-CM | POA: Diagnosis not present

## 2022-04-11 DIAGNOSIS — Z1212 Encounter for screening for malignant neoplasm of rectum: Secondary | ICD-10-CM | POA: Diagnosis not present

## 2022-05-16 DIAGNOSIS — Z01818 Encounter for other preprocedural examination: Secondary | ICD-10-CM | POA: Diagnosis not present

## 2022-05-28 DIAGNOSIS — Z6823 Body mass index (BMI) 23.0-23.9, adult: Secondary | ICD-10-CM | POA: Diagnosis not present

## 2022-05-28 DIAGNOSIS — R062 Wheezing: Secondary | ICD-10-CM | POA: Diagnosis not present

## 2022-06-28 DIAGNOSIS — Z1211 Encounter for screening for malignant neoplasm of colon: Secondary | ICD-10-CM | POA: Diagnosis not present

## 2022-06-28 DIAGNOSIS — R195 Other fecal abnormalities: Secondary | ICD-10-CM | POA: Diagnosis not present

## 2022-06-28 DIAGNOSIS — Z87891 Personal history of nicotine dependence: Secondary | ICD-10-CM | POA: Diagnosis not present

## 2022-07-18 DIAGNOSIS — M549 Dorsalgia, unspecified: Secondary | ICD-10-CM | POA: Diagnosis not present

## 2022-07-18 DIAGNOSIS — Z6824 Body mass index (BMI) 24.0-24.9, adult: Secondary | ICD-10-CM | POA: Diagnosis not present

## 2022-07-18 DIAGNOSIS — Z0000 Encounter for general adult medical examination without abnormal findings: Secondary | ICD-10-CM | POA: Diagnosis not present

## 2022-07-18 DIAGNOSIS — M25562 Pain in left knee: Secondary | ICD-10-CM | POA: Diagnosis not present

## 2022-07-18 DIAGNOSIS — Z1331 Encounter for screening for depression: Secondary | ICD-10-CM | POA: Diagnosis not present

## 2022-07-18 DIAGNOSIS — M79601 Pain in right arm: Secondary | ICD-10-CM | POA: Diagnosis not present

## 2022-11-11 IMAGING — MG MM BREAST BX W LOC DEV 1ST LESION IMAGE BX SPEC STEREO GUIDE*L*
8 of 10 series · 8 of 22 positions shown · non-contrast
Comparison: Previous exams.
COMPARISON: Previous exams.

Addendum:
CLINICAL DATA: 50-year-old female presenting for stereotactic
biopsy of a left breast distortion.

EXAM:
LEFT BREAST STEREOTACTIC CORE NEEDLE BIOPSY

[L (1 of 6)]
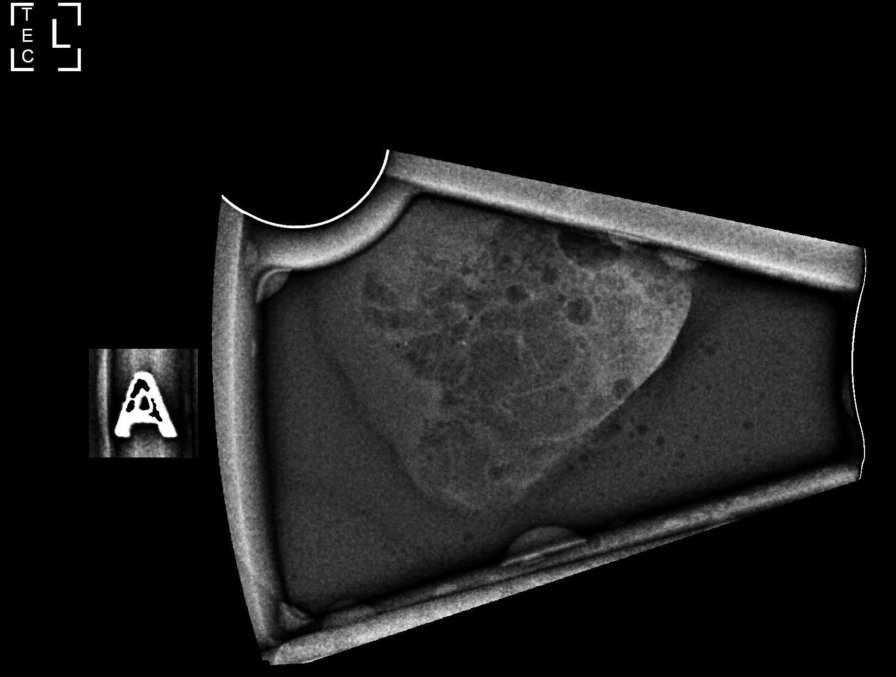

[L (2 of 6)]
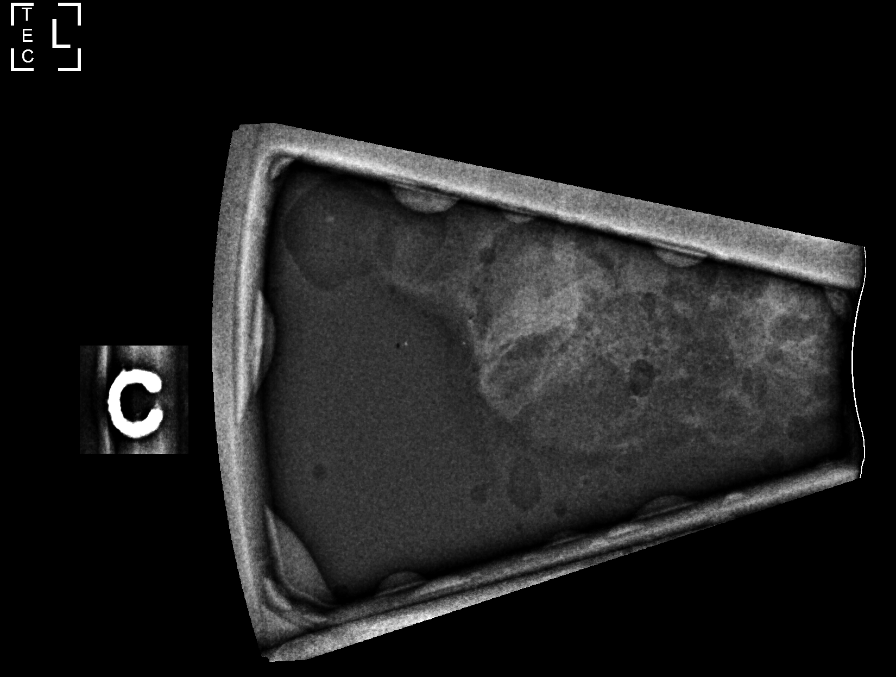

[L (3 of 6)]
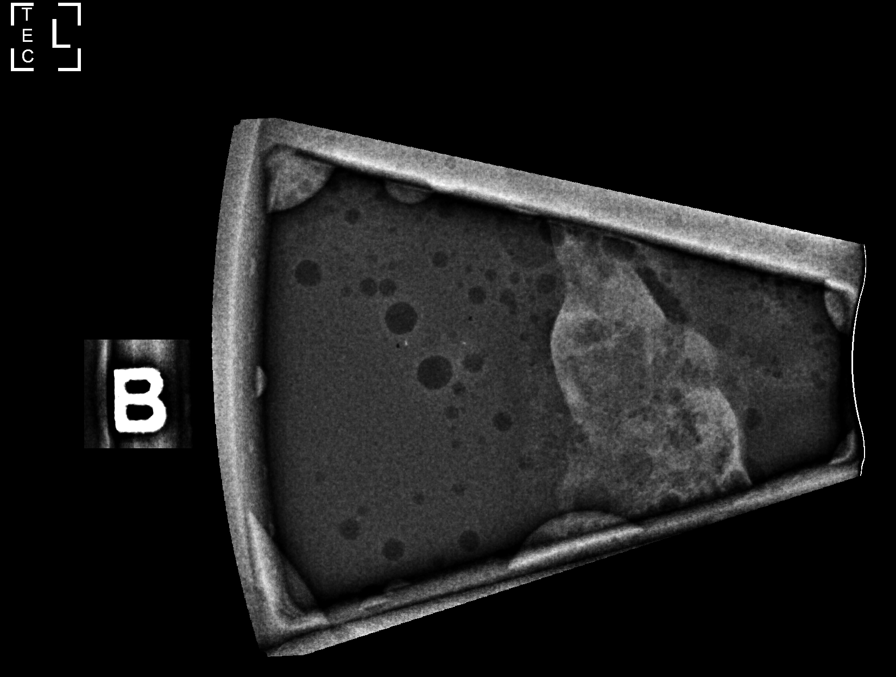

[L (4 of 6)]
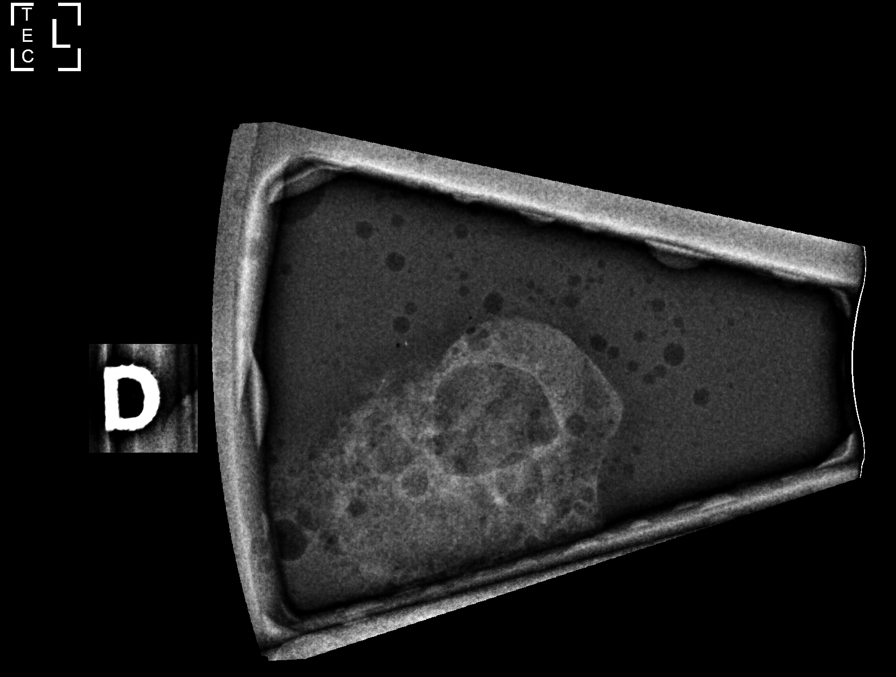

[L (5 of 6)]
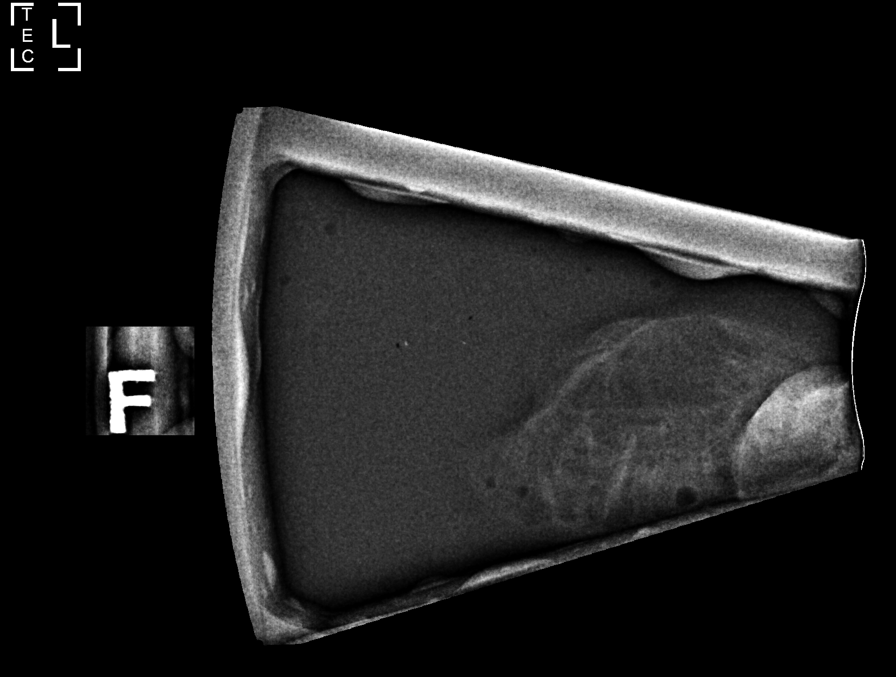

[L (6 of 6)]
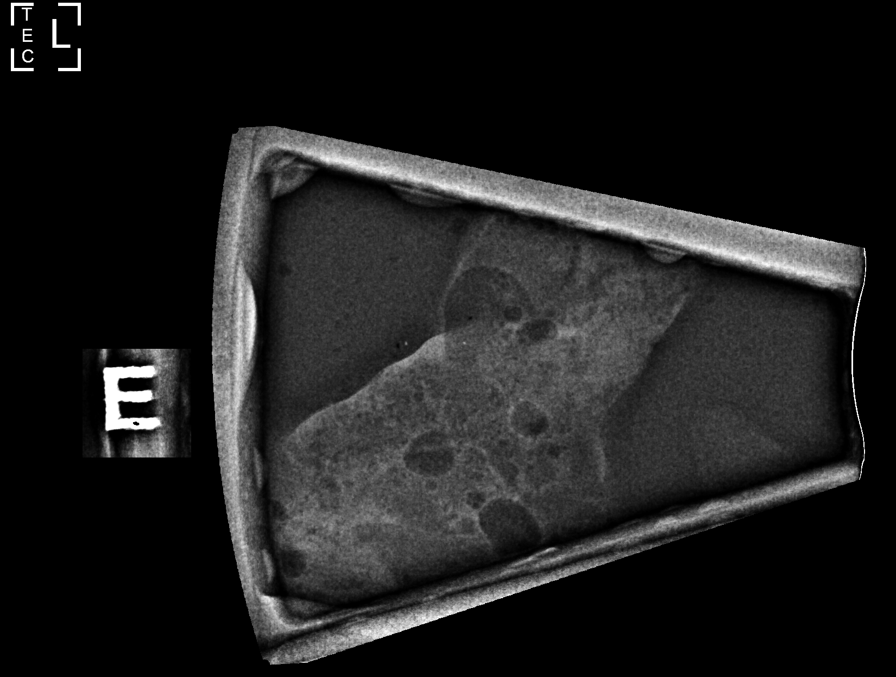

[L ML]
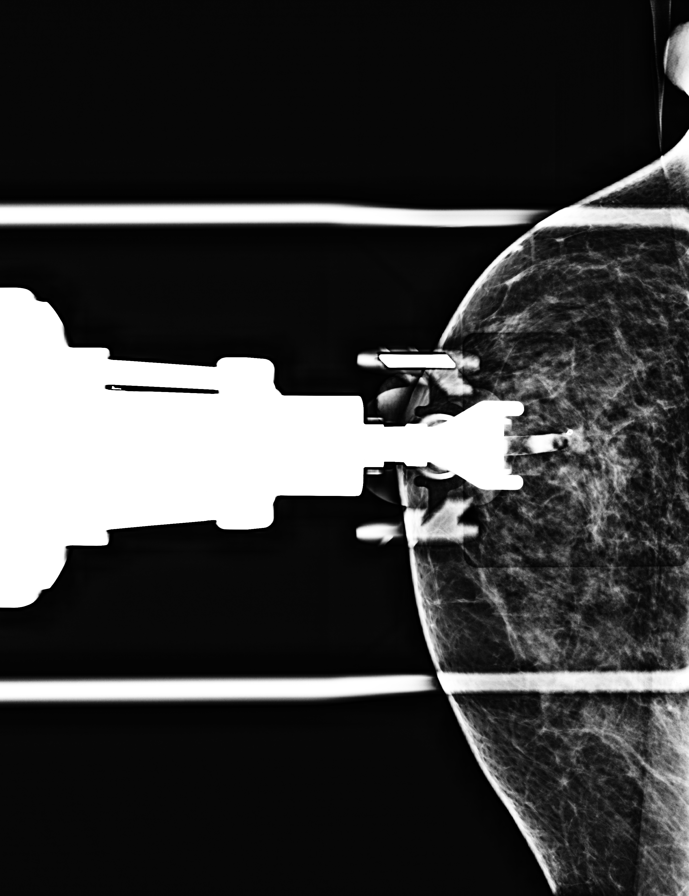

[L ML tomo · tomo slice 29/57.0]
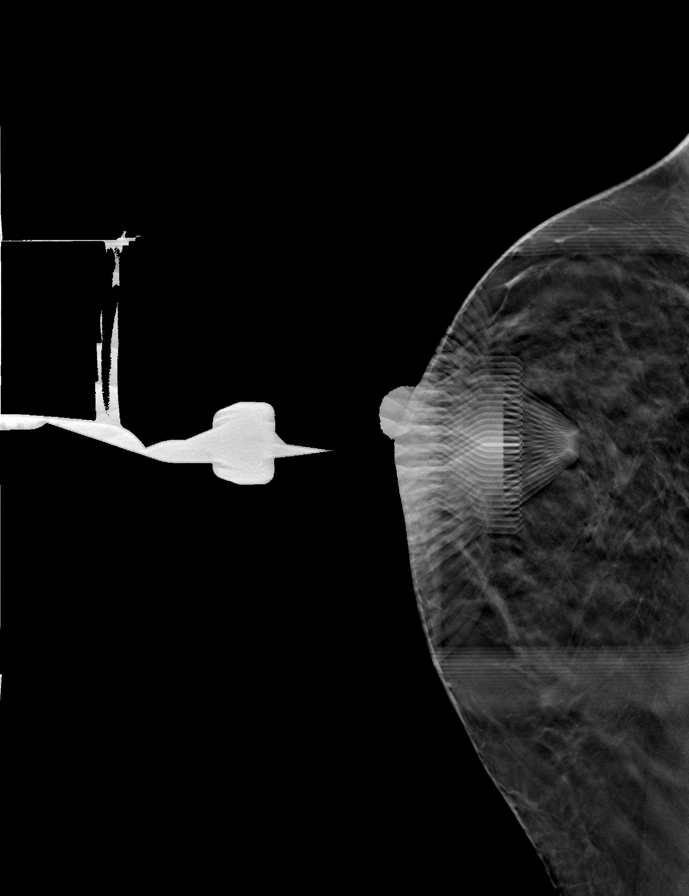

[8 of 22 positions shown; findings below may reference images not displayed]



Using sterile technique and 1% Lidocaine as local anesthetic, under
stereotactic guidance, a 9 gauge vacuum assisted device was used to
perform core needle biopsy of distortion in the upper inner left
breast using a medial approach.

Lesion quadrant: Upper inner quadrant

At the conclusion of the procedure, a coil shaped shaped tissue
marker clip was deployed into the biopsy cavity. Follow-up 2-view
mammogram was performed and dictated separately.
IMPRESSION: Stereotactic-guided biopsy of distortion in the upper inner left
breast. No apparent complications.

ADDENDUM:
Pathology revealed USUAL DUCTAL HYPERPLASIA AND FIBROCYSTIC CHANGES
WITH APOCRINE METAPLASIA of the LEFT breast, upper inner, (coil
clip). This was found to be discordant by Dr. Daisy Tiger, with
excision recommended.

Pathology results were discussed with the patient by telephone. The
patient reported doing well after the biopsy with minimal tenderness
at the site. Post biopsy instructions and care were reviewed and
questions were answered. The patient was encouraged to call The
direct phone number was provided.

Surgical consultation has been arranged with Dr. Burak Rudd at
[REDACTED] on September 20, 2021.

Pathology results reported by Paul Musa, RN on 09/07/2021.



Using sterile technique and 1% Lidocaine as local anesthetic, under
stereotactic guidance, a 9 gauge vacuum assisted device was used to
perform core needle biopsy of distortion in the upper inner left
breast using a medial approach.

Lesion quadrant: Upper inner quadrant

At the conclusion of the procedure, a coil shaped shaped tissue
marker clip was deployed into the biopsy cavity. Follow-up 2-view
mammogram was performed and dictated separately.
IMPRESSION: Stereotactic-guided biopsy of distortion in the upper inner left
breast. No apparent complications.

## 2023-05-22 DIAGNOSIS — Z9109 Other allergy status, other than to drugs and biological substances: Secondary | ICD-10-CM | POA: Diagnosis not present

## 2023-05-22 DIAGNOSIS — Z6825 Body mass index (BMI) 25.0-25.9, adult: Secondary | ICD-10-CM | POA: Diagnosis not present

## 2023-05-22 DIAGNOSIS — N951 Menopausal and female climacteric states: Secondary | ICD-10-CM | POA: Diagnosis not present

## 2023-05-22 DIAGNOSIS — J4520 Mild intermittent asthma, uncomplicated: Secondary | ICD-10-CM | POA: Diagnosis not present

## 2023-07-22 DIAGNOSIS — Z0000 Encounter for general adult medical examination without abnormal findings: Secondary | ICD-10-CM | POA: Diagnosis not present

## 2023-07-22 DIAGNOSIS — Z1231 Encounter for screening mammogram for malignant neoplasm of breast: Secondary | ICD-10-CM | POA: Diagnosis not present

## 2023-07-22 DIAGNOSIS — Z6824 Body mass index (BMI) 24.0-24.9, adult: Secondary | ICD-10-CM | POA: Diagnosis not present

## 2023-07-22 DIAGNOSIS — Z1331 Encounter for screening for depression: Secondary | ICD-10-CM | POA: Diagnosis not present

## 2023-07-22 DIAGNOSIS — E785 Hyperlipidemia, unspecified: Secondary | ICD-10-CM | POA: Diagnosis not present

## 2023-09-29 DIAGNOSIS — R778 Other specified abnormalities of plasma proteins: Secondary | ICD-10-CM | POA: Diagnosis not present

## 2023-10-06 DIAGNOSIS — R778 Other specified abnormalities of plasma proteins: Secondary | ICD-10-CM | POA: Diagnosis not present
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
## 2023-10-28 ENCOUNTER — Telehealth: Payer: Self-pay | Admitting: Oncology

## 2023-10-28 ENCOUNTER — Other Ambulatory Visit: Payer: BC Managed Care – PPO

## 2023-10-28 ENCOUNTER — Inpatient Hospital Stay (HOSPITAL_BASED_OUTPATIENT_CLINIC_OR_DEPARTMENT_OTHER): Payer: BC Managed Care – PPO | Admitting: Oncology

## 2023-10-28 ENCOUNTER — Inpatient Hospital Stay: Payer: BC Managed Care – PPO | Admitting: Oncology

## 2023-10-28 ENCOUNTER — Encounter: Payer: Self-pay | Admitting: Oncology

## 2023-10-28 ENCOUNTER — Inpatient Hospital Stay: Payer: BC Managed Care – PPO | Attending: Oncology

## 2023-10-28 VITALS — BP 129/67 | HR 70 | Temp 98.5°F | Resp 16 | Ht 67.0 in | Wt 154.7 lb

## 2023-10-28 DIAGNOSIS — D472 Monoclonal gammopathy: Secondary | ICD-10-CM | POA: Diagnosis not present

## 2023-10-28 DIAGNOSIS — E538 Deficiency of other specified B group vitamins: Secondary | ICD-10-CM | POA: Insufficient documentation

## 2023-10-28 DIAGNOSIS — R768 Other specified abnormal immunological findings in serum: Secondary | ICD-10-CM | POA: Insufficient documentation

## 2023-10-28 DIAGNOSIS — R779 Abnormality of plasma protein, unspecified: Secondary | ICD-10-CM | POA: Insufficient documentation

## 2023-10-28 LAB — CBC WITH DIFFERENTIAL (CANCER CENTER ONLY)
Abs Immature Granulocytes: 0 10*3/uL (ref 0.00–0.07)
Basophils Absolute: 0 10*3/uL (ref 0.0–0.1)
Basophils Relative: 1 %
Eosinophils Absolute: 0.1 10*3/uL (ref 0.0–0.5)
Eosinophils Relative: 1 %
HCT: 39.5 % (ref 36.0–46.0)
Hemoglobin: 13.4 g/dL (ref 12.0–15.0)
Immature Granulocytes: 0 %
Lymphocytes Relative: 25 %
Lymphs Abs: 1.2 10*3/uL (ref 0.7–4.0)
MCH: 29.1 pg (ref 26.0–34.0)
MCHC: 33.9 g/dL (ref 30.0–36.0)
MCV: 85.7 fL (ref 80.0–100.0)
Monocytes Absolute: 0.3 10*3/uL (ref 0.1–1.0)
Monocytes Relative: 5 %
Neutro Abs: 3.3 10*3/uL (ref 1.7–7.7)
Neutrophils Relative %: 68 %
Platelet Count: 301 10*3/uL (ref 150–400)
RBC: 4.61 MIL/uL (ref 3.87–5.11)
RDW: 13.3 % (ref 11.5–15.5)
WBC Count: 4.9 10*3/uL (ref 4.0–10.5)
nRBC: 0 % (ref 0.0–0.2)
nRBC: 0 /100{WBCs}

## 2023-10-28 LAB — BASIC METABOLIC PANEL - CANCER CENTER ONLY
Anion gap: 10 (ref 5–15)
BUN: 9 mg/dL (ref 6–20)
CO2: 27 mmol/L (ref 22–32)
Calcium: 9.5 mg/dL (ref 8.9–10.3)
Chloride: 102 mmol/L (ref 98–111)
Creatinine: 0.81 mg/dL (ref 0.44–1.00)
GFR, Estimated: >60 mL/min (ref >60)
Glucose, Bld: 108 mg/dL — ABNORMAL HIGH (ref 70–99)
Potassium: 4 mmol/L (ref 3.5–5.1)
Sodium: 138 mmol/L (ref 135–145)

## 2023-10-28 LAB — VITAMIN B12: Vitamin B-12: 175 pg/mL — ABNORMAL LOW (ref 180–914)

## 2023-10-28 LAB — IRON AND TIBC
Iron: 77 ug/dL (ref 28–170)
Saturation Ratios: 21 % (ref 10.4–31.8)
TIBC: 374 ug/dL (ref 250–450)
UIBC: 297 ug/dL

## 2023-10-28 LAB — FERRITIN: Ferritin: 34 ng/mL (ref 11–307)

## 2023-10-28 NOTE — Telephone Encounter (Signed)
10/28/23 Spoke with patient and confirmed next appt.

## 2023-10-29 LAB — KAPPA/LAMBDA LIGHT CHAINS
Kappa free light chain: 15.2 mg/L (ref 3.3–19.4)
Kappa, lambda light chain ratio: 1.54 (ref 0.26–1.65)
Lambda free light chains: 9.9 mg/L (ref 5.7–26.3)

## 2023-10-29 LAB — BETA 2 MICROGLOBULIN, SERUM: Beta-2 Microglobulin: 1.5 mg/L (ref 0.6–2.4)

## 2023-10-31 ENCOUNTER — Encounter: Payer: Self-pay | Admitting: Oncology

## 2023-11-07 LAB — IMMUNOFIXATION ELECTROPHORESIS
IgA: 76 mg/dL — ABNORMAL LOW (ref 87–352)
IgG (Immunoglobin G), Serum: 3470 mg/dL — ABNORMAL HIGH (ref 586–1602)
IgM (Immunoglobulin M), Srm: 54 mg/dL (ref 26–217)
Total Protein ELP: 9 g/dL — ABNORMAL HIGH (ref 6.0–8.5)

## 2023-11-09 LAB — PROTEIN ELECTROPHORESIS, SERUM
A/G Ratio: 0.9 (ref 0.7–1.7)
Albumin ELP: 4.1 g/dL (ref 2.9–4.4)
Alpha-1-Globulin: 0.2 g/dL (ref 0.0–0.4)
Alpha-2-Globulin: 0.7 g/dL (ref 0.4–1.0)
Beta Globulin: 0.9 g/dL (ref 0.7–1.3)
Gamma Globulin: 2.8 g/dL — ABNORMAL HIGH (ref 0.4–1.8)
Globulin, Total: 4.7 g/dL — ABNORMAL HIGH (ref 2.2–3.9)
M-Spike, %: 2.1 g/dL — ABNORMAL HIGH
Total Protein ELP: 8.8 g/dL — ABNORMAL HIGH (ref 6.0–8.5)

## 2023-11-10 DIAGNOSIS — E538 Deficiency of other specified B group vitamins: Secondary | ICD-10-CM | POA: Diagnosis not present

## 2023-11-10 DIAGNOSIS — R779 Abnormality of plasma protein, unspecified: Secondary | ICD-10-CM | POA: Diagnosis not present

## 2023-11-10 DIAGNOSIS — D472 Monoclonal gammopathy: Secondary | ICD-10-CM | POA: Diagnosis not present

## 2023-11-10 DIAGNOSIS — R768 Other specified abnormal immunological findings in serum: Secondary | ICD-10-CM | POA: Diagnosis not present

## 2023-11-10 NOTE — Progress Notes (Signed)
 [facility]  [address] [place],  [place]  [address] [phone]  Clinic Day:  11/11/2023  Referring physician: Jefferey Fitch, MD  HISTORY OF PRESENT ILLNESS:  The patient is a 52 y.o. female who our office recently began seeing for an elevated protein level.  She comes in today to go over her monoclonal test results.  If a plasma cell dyscrasia is present.  Since her last visit, the patient has been doing fairly well.  Of note, she does complain of lower back and hip pain.  PHYSICAL EXAM:  Blood pressure 127/77, pulse 74, temperature 98.2 F (36.8 C), temperature source Oral, resp. rate 16, height 5' 7 (1.702 m), weight 155 lb 9.6 oz (70.6 kg), SpO2 100%. Wt Readings from Last 3 Encounters:  11/11/23 155 lb 9.6 oz (70.6 kg)  10/28/23 154 lb 11.2 oz (70.2 kg)  10/18/21 152 lb 1.9 oz (69 kg)   Body mass index is 24.37 kg/m. Performance status (ECOG): 1 - Symptomatic but completely ambulatory Physical Exam Constitutional:      Appearance: Normal appearance. She is not ill-appearing.  HENT:     Mouth/Throat:     Mouth: Mucous membranes are moist.     Pharynx: Oropharynx is clear. No oropharyngeal exudate or posterior oropharyngeal erythema.  Cardiovascular:     Rate and Rhythm: Normal rate and regular rhythm.     Heart sounds: No murmur heard.    No friction rub. No gallop.  Pulmonary:     Effort: Pulmonary effort is normal. No respiratory distress.     Breath sounds: Normal breath sounds. No wheezing, rhonchi or rales.  Abdominal:     General: Bowel sounds are normal. There is no distension.     Palpations: Abdomen is soft. There is no mass.     Tenderness: There is no abdominal tenderness.  Musculoskeletal:        General: No swelling.     Right lower leg: No edema.     Left lower leg: No edema.  Lymphadenopathy:     Cervical: No cervical adenopathy.     Upper Body:     Right upper body: No supraclavicular or axillary  adenopathy.     Left upper body: No supraclavicular or axillary adenopathy.     Lower Body: No right inguinal adenopathy. No left inguinal adenopathy.  Skin:    General: Skin is warm.     Coloration: Skin is not jaundiced.     Findings: No lesion or rash.  Neurological:     General: No focal deficit present.     Mental Status: She is alert and oriented to person, place, and time. Mental status is at baseline.  Psychiatric:        Mood and Affect: Mood normal.        Behavior: Behavior normal.        Thought Content: Thought content normal.    LABS:      Latest Ref Rng & Units 10/28/2023   10:10 AM 08/10/2014    1:21 PM 08/09/2013   11:05 AM  CBC  WBC 4.0 - 10.5 K/uL 4.9  6.1  7.0   Hemoglobin 12.0 - 15.0 g/dL 86.5  85.3    85.8  85.8    14.6   Hematocrit 36.0 - 46.0 % 39.5  41.5  42.1   Platelets 150 - 400 K/uL 301  274  281       Latest Ref Rng & Units 10/28/2023  10:10 AM 08/10/2014    1:21 PM 08/09/2013   11:05 AM  CMP  Glucose 70 - 99 mg/dL 891  77  84   BUN 6 - 20 mg/dL 9  13  12    Creatinine 0.44 - 1.00 mg/dL 9.18  9.21  9.22   Sodium 135 - 145 mmol/L 138  141  140   Potassium 3.5 - 5.1 mmol/L 4.0  4.5  4.4   Chloride 98 - 111 mmol/L 102  104  103   CO2 22 - 32 mmol/L 27  24  28    Calcium 8.9 - 10.3 mg/dL 9.5  9.4  9.4   Total Protein 6.0 - 8.3 g/dL  6.9  7.0   Total Bilirubin 0.2 - 1.2 mg/dL  0.5  0.7   Alkaline Phos 39 - 117 U/L  48  46   AST 0 - 37 U/L  19  21   ALT 0 - 35 U/L  16  16     Latest Reference Range & Units 10/28/23 10:10  M-SPIKE, % Not Observed g/dL 2.1 (H)  (H): Data is abnormally high  Latest Reference Range & Units 10/28/23 10:11  IgG (Immunoglobin G), Serum 586 - 1,602 mg/dL 6,529 (H)  IgM (Immunoglobulin M), Srm 26 - 217 mg/dL 54  IgA 87 - 647 mg/dL 76 (L)  (H): Data is abnormally high (L): Data is abnormally low  Latest Reference Range & Units 10/28/23 10:10  Kappa free light chain 3.3 - 19.4 mg/L 15.2  Lambda free light chains  5.7 - 26.3 mg/L 9.9  Kappa, lambda light chain ratio 0.26 - 1.65  1.54    ASSESSMENT & PLAN:  Assessment/Plan:  A 52 y.o. female who our office recently began seeing with elevated protein levels.  In clinic today, I went over all of her recent labs with her, which unfortunately suggest a plasma cell dyscrasia, such as multiple myeloma, is present.  This is reflected by her very positive M spike and her elevated IgG level.  Based upon these results, the patient will undergo a bone marrow biopsy in the forthcoming days to rule out the possibility of multiple myeloma being present.  I will see her 2 weeks after her bone marrow biopsy is done to go over the results and their implications.  Although somewhat despondent, the patient understands all the plans discussed today and is in agreement with them.    Aavya Shafer DELENA Kerns, MD

## 2023-11-11 ENCOUNTER — Inpatient Hospital Stay: Payer: BC Managed Care – PPO

## 2023-11-11 ENCOUNTER — Inpatient Hospital Stay (HOSPITAL_BASED_OUTPATIENT_CLINIC_OR_DEPARTMENT_OTHER): Payer: BC Managed Care – PPO | Admitting: Oncology

## 2023-11-11 VITALS — BP 127/77 | HR 74 | Temp 98.2°F | Resp 16 | Ht 67.0 in | Wt 155.6 lb

## 2023-11-11 DIAGNOSIS — D472 Monoclonal gammopathy: Secondary | ICD-10-CM | POA: Insufficient documentation

## 2023-11-11 DIAGNOSIS — R779 Abnormality of plasma protein, unspecified: Secondary | ICD-10-CM

## 2023-11-11 DIAGNOSIS — E538 Deficiency of other specified B group vitamins: Secondary | ICD-10-CM | POA: Diagnosis not present

## 2023-11-11 DIAGNOSIS — R768 Other specified abnormal immunological findings in serum: Secondary | ICD-10-CM | POA: Diagnosis not present

## 2023-11-13 ENCOUNTER — Other Ambulatory Visit: Payer: Self-pay | Admitting: Oncology

## 2023-11-13 DIAGNOSIS — D472 Monoclonal gammopathy: Secondary | ICD-10-CM

## 2023-11-14 LAB — UPEP/UIFE/LIGHT CHAINS/TP, 24-HR UR
% BETA, Urine: 0 %
ALPHA 1 URINE: 0 %
Albumin, U: 100 %
Alpha 2, Urine: 0 %
Free Kappa Lt Chains,Ur: 5.72 mg/L (ref 1.17–86.46)
Free Kappa/Lambda Ratio: 5.15 (ref 1.83–14.26)
Free Lambda Lt Chains,Ur: 1.11 mg/L (ref 0.27–15.21)
GAMMA GLOBULIN URINE: 0 %
Total Protein, Urine-Ur/day: 139 mg/(24.h) (ref 30–150)
Total Protein, Urine: 6.3 mg/dL
Total Volume: 2200

## 2023-11-19 ENCOUNTER — Other Ambulatory Visit (HOSPITAL_COMMUNITY)
Admission: RE | Admit: 2023-11-19 | Discharge: 2023-11-19 | Disposition: A | Discharge status: Home / Self Care | Payer: BC Managed Care – PPO | Source: Ambulatory Visit | Attending: Oncology | Admitting: Oncology

## 2023-11-19 DIAGNOSIS — D472 Monoclonal gammopathy: Secondary | ICD-10-CM | POA: Diagnosis not present

## 2023-11-19 LAB — LAB REPORT - SCANNED: EGFR: 103

## 2023-11-21 LAB — SURGICAL PATHOLOGY

## 2023-11-27 ENCOUNTER — Encounter: Payer: Self-pay | Admitting: Oncology

## 2023-12-01 ENCOUNTER — Encounter (HOSPITAL_COMMUNITY): Payer: Self-pay | Admitting: Oncology

## 2023-12-02 ENCOUNTER — Inpatient Hospital Stay: Payer: BC Managed Care – PPO | Attending: Oncology | Admitting: Oncology

## 2023-12-02 ENCOUNTER — Other Ambulatory Visit: Payer: Self-pay | Admitting: Oncology

## 2023-12-02 VITALS — BP 124/66 | HR 80 | Temp 99.2°F | Resp 18 | Ht 67.0 in | Wt 156.1 lb

## 2023-12-02 DIAGNOSIS — D472 Monoclonal gammopathy: Secondary | ICD-10-CM

## 2023-12-02 DIAGNOSIS — R779 Abnormality of plasma protein, unspecified: Secondary | ICD-10-CM | POA: Insufficient documentation

## 2023-12-02 NOTE — Progress Notes (Signed)
[facility]  [address] [place],  [place]  [address] [phone]  Clinic Day:  12/02/2023  Referring physician: Philemon Kingdom, MD  HISTORY OF PRESENT ILLNESS:  The patient is a 53 y.o. female who comes in today to go over her bone marrow biopsy results.  Her bone marrow was done due to her having elevated monoclonal parameters that were suspicious for potential multiple myeloma.  Since her last visit, the patient has been doing fine.  She still notices lower back and hip pain, which raises the suspicion for multiple myeloma being present.    PHYSICAL EXAM:  Blood pressure 124/66, pulse 80, temperature 99.2 F (37.3 C), temperature source Oral, resp. rate 18, height 5\' 7"  (1.702 m), weight 156 lb 1.6 oz (70.8 kg), SpO2 100%. Wt Readings from Last 3 Encounters:  12/02/23 156 lb 1.6 oz (70.8 kg)  11/11/23 155 lb 9.6 oz (70.6 kg)  10/28/23 154 lb 11.2 oz (70.2 kg)   Body mass index is 24.45 kg/m. Performance status (ECOG): 1 - Symptomatic but completely ambulatory Physical Exam Constitutional:      Appearance: Normal appearance. She is not ill-appearing.  HENT:     Mouth/Throat:     Mouth: Mucous membranes are moist.     Pharynx: Oropharynx is clear. No oropharyngeal exudate or posterior oropharyngeal erythema.  Cardiovascular:     Rate and Rhythm: Normal rate and regular rhythm.     Heart sounds: No murmur heard.    No friction rub. No gallop.  Pulmonary:     Effort: Pulmonary effort is normal. No respiratory distress.     Breath sounds: Normal breath sounds. No wheezing, rhonchi or rales.  Abdominal:     General: Bowel sounds are normal. There is no distension.     Palpations: Abdomen is soft. There is no mass.     Tenderness: There is no abdominal tenderness.  Musculoskeletal:        General: No swelling.     Right lower leg: No edema.     Left lower leg: No edema.  Lymphadenopathy:     Cervical: No cervical adenopathy.      Upper Body:     Right upper body: No supraclavicular or axillary adenopathy.     Left upper body: No supraclavicular or axillary adenopathy.     Lower Body: No right inguinal adenopathy. No left inguinal adenopathy.  Skin:    General: Skin is warm.     Coloration: Skin is not jaundiced.     Findings: No lesion or rash.  Neurological:     General: No focal deficit present.     Mental Status: She is alert and oriented to person, place, and time. Mental status is at baseline.  Psychiatric:        Mood and Affect: Mood normal.        Behavior: Behavior normal.        Thought Content: Thought content normal.    PATHOLOGY: Her bone marrow biopsy revealed the following:  DIAGNOSIS:  BONE MARROW, ASPIRATE, CLOT, CORE: -  Kappa restricted plasma cell neoplasm involving approximate 10% of the overall marrow space -  Essentially absent histiocytic iron stores.  PERIPHERAL BLOOD: -  Overall unremarkable.  Note: It is noted the patient has an SPEP/IFE that identified a IgG monoclonal paraprotein with kappa light chain restriction within IgG of 3407 the milligrams per deciliter and an M protein of 2.1 g/dL.  The kappa/lambda free light chains are not elevated  and neither is the F lambda light chain ratio.  The beta-2 microglobulin is within normal limits.  UPEP/you IFE is also unremarkable.  The overall biopsy and clot section have extremely limited material for evaluation is considered suboptimal; however, kappa restricted plasma cells with aberrant CD56 and CD117 expression are identified.  The overall involvement on this limited specimen is interpreted as approximately 10%.  Clinical/radiologic/serologic correlation recommended.  MICROSCOPIC DESCRIPTION:  PERIPHERAL BLOOD SMEAR: The peripheral blood smear and indices are reviewed revealing essentially normal indices and morphology with no evidence of rouleaux or circulating plasma cells.  BONE MARROW ASPIRATE: The bone marrow  aspirate smear slides contain several cellular bone marrow spicules which are adequate for interpretation. Erythroid precursors: Erythroid series is present adequate number with orderly maturation and unremarkable morphology. Granulocytic precursors: Myeloid series is present in adequate number with orderly maturation and unremarkable morphology. Megakaryocytes: The megakaryocytes are present adequate number with unremarkable morphology. Lymphocytes/plasma cells: Plasma cells are mild to moderately increased at approximately 9% on a 500 cell count.  They are overall mature and normal in appearance.  Lymphocytes are not increased and are small round and mature.  TOUCH PREPARATIONS: Touch prep shows similar morphology to the aspirate smear slides, refer to above  CLOT AND BIOPSY: The decalcified bone marrow biopsy consists of a core of cortical and trabecular bone with extensive aspiration artifact and extremely limited crushed hematopoietic marrow for evaluation.  The hematopoietic marrow present for evaluation appears to have orderly trilineage hematopoiesis and without significant plasma cells identified on HE-stained slide.  This is overall suboptimal for evaluation  The clot section consistent predominantly periosteum, fragments of bone and extremely limited hematopoietic marrow with a cellularity difficult to assess given the scant amount (interpreted as 50 to 60%).  This is overall suboptimal for evaluation this limited material shows orderly trilineage hematopoiesis with a few scattered plasma cells.  IMMUNOHISTOCHEMICAL STAINS: CD138 and MUM1 highlight mildly increased plasma cells (approximately 10% on a very limited material and crush specimen.  Kappa/lambda ISH highlights a predominantly kappa positive plasma cells with only scattered lambda positive cells.  This interpretation is slightly limited given the crush nature of the specimen.  CD56 and CD117 appear to be  aberrantly expressed in these plasma cells.  Cyclin D1 appears negative in the plasma cells.  IRON STAIN: Iron stains are performed on a bone marrow aspirate or touch imprint smear and section of clot. The controls stained appropriately.       Storage Iron: Essentially absent histiocytic iron stores      Ring Sideroblasts: Not identified       LABS:      Latest Ref Rng & Units 10/28/2023   10:10 AM 08/10/2014    1:21 PM 08/09/2013   11:05 AM  CBC  WBC 4.0 - 10.5 K/uL 4.9  6.1  7.0   Hemoglobin 12.0 - 15.0 g/dL 91.4  78.2    95.6  21.3    14.6   Hematocrit 36.0 - 46.0 % 39.5  41.5  42.1   Platelets 150 - 400 K/uL 301  274  281       Latest Ref Rng & Units 10/28/2023   10:10 AM 08/10/2014    1:21 PM 08/09/2013   11:05 AM  CMP  Glucose 70 - 99 mg/dL 086  77  84   BUN 6 - 20 mg/dL 9  13  12    Creatinine 0.44 - 1.00 mg/dL 5.78  4.69  6.29   Sodium 135 -  145 mmol/L 138  141  140   Potassium 3.5 - 5.1 mmol/L 4.0  4.5  4.4   Chloride 98 - 111 mmol/L 102  104  103   CO2 22 - 32 mmol/L 27  24  28    Calcium 8.9 - 10.3 mg/dL 9.5  9.4  9.4   Total Protein 6.0 - 8.3 g/dL  6.9  7.0   Total Bilirubin 0.2 - 1.2 mg/dL  0.5  0.7   Alkaline Phos 39 - 117 U/L  48  46   AST 0 - 37 U/L  19  21   ALT 0 - 35 U/L  16  16     Latest Reference Range & Units 10/28/23 10:10  M-SPIKE, % Not Observed g/dL 2.1 (H)  (H): Data is abnormally high  Latest Reference Range & Units 10/28/23 10:11  IgG (Immunoglobin G), Serum 586 - 1,602 mg/dL 6,440 (H)  IgM (Immunoglobulin M), Srm 26 - 217 mg/dL 54  IgA 87 - 347 mg/dL 76 (L)  (H): Data is abnormally high (L): Data is abnormally low  Latest Reference Range & Units 10/28/23 10:10  Kappa free light chain 3.3 - 19.4 mg/L 15.2  Lambda free light chains 5.7 - 26.3 mg/L 9.9  Kappa, lambda light chain ratio 0.26 - 1.65  1.54    ASSESSMENT & PLAN  Assessment/Plan:  A 53 y.o. female whose labs with an underlying plasma cell dyscrasia.  I spoke with  hematopathology concerning her case, who felt that her bone marrow sample just was not adequate enough to give the best possible reading of her plasma cell percentage.  It has been recommended that her bone marrow be repeated.  I am comforted by the fact that her cytogenetics and myeloma FISH panel both came back normal.  Such studies are qualitative in nature, for which repeat studies are not necessary.  When factoring in these normal studies with her normal hemoglobin, renal function and calcium levels, this patient may have, at worst, smoldering multiple myeloma.  For completeness, I will perform her bone marrow on Monday, January 27th.  I will also have her undergo a skeletal survey to determine if lytic lesions are present, which, by itself, would qualify her as having active multiple myeloma.  I will see her back on February 10th to go over her bone marrow results, skeletal survey, and their implications.  The patient understands all the plans discussed today and is in agreement with them.    Onie Hayashi Kirby Funk, MD

## 2023-12-08 ENCOUNTER — Inpatient Hospital Stay: Payer: BC Managed Care – PPO

## 2023-12-08 ENCOUNTER — Other Ambulatory Visit: Payer: Self-pay | Admitting: Oncology

## 2023-12-08 ENCOUNTER — Other Ambulatory Visit: Payer: Self-pay

## 2023-12-08 ENCOUNTER — Inpatient Hospital Stay (HOSPITAL_BASED_OUTPATIENT_CLINIC_OR_DEPARTMENT_OTHER): Payer: BC Managed Care – PPO | Admitting: Oncology

## 2023-12-08 VITALS — BP 131/74 | HR 73 | Temp 98.4°F | Resp 14 | Ht 67.0 in | Wt 155.0 lb

## 2023-12-08 DIAGNOSIS — D704 Cyclic neutropenia: Secondary | ICD-10-CM | POA: Diagnosis not present

## 2023-12-08 DIAGNOSIS — R779 Abnormality of plasma protein, unspecified: Secondary | ICD-10-CM

## 2023-12-08 DIAGNOSIS — D472 Monoclonal gammopathy: Secondary | ICD-10-CM | POA: Diagnosis not present

## 2023-12-08 DIAGNOSIS — C9010 Plasma cell leukemia not having achieved remission: Secondary | ICD-10-CM | POA: Diagnosis not present

## 2023-12-08 LAB — CBC WITH DIFFERENTIAL (CANCER CENTER ONLY)
Abs Immature Granulocytes: 0.01 10*3/uL (ref 0.00–0.07)
Basophils Absolute: 0 10*3/uL (ref 0.0–0.1)
Basophils Relative: 1 %
Eosinophils Absolute: 0.2 10*3/uL (ref 0.0–0.5)
Eosinophils Relative: 4 %
HCT: 40 % (ref 36.0–46.0)
Hemoglobin: 13.6 g/dL (ref 12.0–15.0)
Immature Granulocytes: 0 %
Lymphocytes Relative: 39 %
Lymphs Abs: 2.1 10*3/uL (ref 0.7–4.0)
MCH: 29.1 pg (ref 26.0–34.0)
MCHC: 34 g/dL (ref 30.0–36.0)
MCV: 85.5 fL (ref 80.0–100.0)
Monocytes Absolute: 0.3 10*3/uL (ref 0.1–1.0)
Monocytes Relative: 5 %
Neutro Abs: 2.8 10*3/uL (ref 1.7–7.7)
Neutrophils Relative %: 51 %
Platelet Count: 264 10*3/uL (ref 150–400)
RBC: 4.68 MIL/uL (ref 3.87–5.11)
RDW: 12.8 % (ref 11.5–15.5)
WBC Count: 5.4 10*3/uL (ref 4.0–10.5)
nRBC: 0 % (ref 0.0–0.2)
nRBC: 0 /100{WBCs}

## 2023-12-08 MED ORDER — LIDOCAINE HCL (PF) 1 % IJ SOLN
INTRAMUSCULAR | Status: AC
  Filled 2023-12-08: qty 5

## 2023-12-08 NOTE — Progress Notes (Signed)
As the patient's first bone marrow biopsy per interventional radiology was inconclusive, a second bone marrow biopsy was done today to get a better plasma cell count to determine whether she has MGUS or multiple myeloma.  After consenting for the procedure, the patient's right posterior superior iliac crest was topically sterilized.  Afterwards, bone marrow core and aspirate were collected, which will be sent for further analysis.  There were no complications experienced with this procedure.

## 2023-12-10 LAB — SURGICAL PATHOLOGY

## 2023-12-18 ENCOUNTER — Ambulatory Visit (HOSPITAL_BASED_OUTPATIENT_CLINIC_OR_DEPARTMENT_OTHER)
Admission: RE | Admit: 2023-12-18 | Discharge: 2023-12-18 | Disposition: A | Discharge status: Home / Self Care | Payer: BC Managed Care – PPO | Source: Ambulatory Visit | Attending: Oncology | Admitting: Oncology

## 2023-12-18 DIAGNOSIS — D472 Monoclonal gammopathy: Secondary | ICD-10-CM

## 2023-12-18 DIAGNOSIS — C9000 Multiple myeloma not having achieved remission: Secondary | ICD-10-CM | POA: Diagnosis not present

## 2023-12-18 DIAGNOSIS — M47812 Spondylosis without myelopathy or radiculopathy, cervical region: Secondary | ICD-10-CM | POA: Diagnosis not present

## 2023-12-21 NOTE — Progress Notes (Signed)
[facility]  [address] [place],  [place]  [address] [phone]  Clinic Day:  12/22/2023  Referring physician: Philemon Kingdom, MD  HISTORY OF PRESENT ILLNESS:  The patient is a 53 y.o. female with a monoclonal paraproteinemia.  She comes in today to go over her second bone marrow biopsy, which was done to establish the exact percentage of plasma cells in her bone marrow.  She also comes in today to go over her skeletal survey results.  Since her last visit, the patient has been doing fairly well.  She still complains of lower back pain, which has been chronic in nature.  She denies having any new symptoms/findings which concern her for multiple myeloma overtly being present.  PHYSICAL EXAM:  Blood pressure 127/69, pulse (!) 59, temperature 98.1 F (36.7 C), temperature source Oral, resp. rate 14, height 5\' 7"  (1.702 m), weight 159 lb 3.2 oz (72.2 kg), SpO2 99%. Wt Readings from Last 3 Encounters:  12/22/23 159 lb 3.2 oz (72.2 kg)  12/08/23 155 lb (70.3 kg)  12/02/23 156 lb 1.6 oz (70.8 kg)   Body mass index is 24.93 kg/m. Performance status (ECOG): 1 - Symptomatic but completely ambulatory Physical Exam Constitutional:      Appearance: Normal appearance. She is not ill-appearing.  HENT:     Mouth/Throat:     Mouth: Mucous membranes are moist.     Pharynx: Oropharynx is clear. No oropharyngeal exudate or posterior oropharyngeal erythema.  Cardiovascular:     Rate and Rhythm: Normal rate and regular rhythm.     Heart sounds: No murmur heard.    No friction rub. No gallop.  Pulmonary:     Effort: Pulmonary effort is normal. No respiratory distress.     Breath sounds: Normal breath sounds. No wheezing, rhonchi or rales.  Abdominal:     General: Bowel sounds are normal. There is no distension.     Palpations: Abdomen is soft. There is no mass.     Tenderness: There is no abdominal tenderness.  Musculoskeletal:        General: No  swelling.     Right lower leg: No edema.     Left lower leg: No edema.  Lymphadenopathy:     Cervical: No cervical adenopathy.     Upper Body:     Right upper body: No supraclavicular or axillary adenopathy.     Left upper body: No supraclavicular or axillary adenopathy.     Lower Body: No right inguinal adenopathy. No left inguinal adenopathy.  Skin:    General: Skin is warm.     Coloration: Skin is not jaundiced.     Findings: No lesion or rash.  Neurological:     General: No focal deficit present.     Mental Status: She is alert and oriented to person, place, and time. Mental status is at baseline.  Psychiatric:        Mood and Affect: Mood normal.        Behavior: Behavior normal.        Thought Content: Thought content normal.    PATHOLOGY: Her bone marrow biopsy revealed the following: DIAGNOSIS:  BONE MARROW, ASPIRATE, CLOT, CORE: -Normocellular bone marrow with plasma cell neoplasm -See comment  PERIPHERAL BLOOD: -No significant morphologic abnormalities  COMMENT:  The bone marrow is generally normocellular for age with trilineage hematopoiesis.  In this background, the plasma cells are increased in number representing 9% of all cells with lack of large aggregates or  sheets and display kappa light chain restriction consistent with plasma cell neoplasm.  MICROSCOPIC DESCRIPTION:  PERIPHERAL BLOOD SMEAR: The red blood cells display mild anisopoikilocytosis with mild polychromasia.  The white blood cells are normal in number with no significant morphologic abnormalities.  The platelets are normal in number.  BONE MARROW ASPIRATE: Bone marrow particles present Erythroid precursors: Progressive maturation with only occasional late precursors displaying nuclear cytoplasmic dyssynchrony. Granulocytic precursors: Orderly and progressive maturation Megakaryocytes: Abundant with predominantly normal morphology Lymphocytes/plasma cells: The plasma cells are increased  in number representing 9% of all cells with lack of large aggregates or sheets. Many of the plasma cells display cytomegaly and/or small nucleoli. Large lymphoid aggregates are not seen.  TOUCH PREPARATIONS: A mixture of cell types but with relative abundance of plasma cells  CLOT AND BIOPSY: The clot sections are suboptimal with rare very minute bone marrow particles present.  The core biopsy is small but shows an estimated 40% cellularity with a mixture of cell types.  Large clusters or sheets of plasma cells are not identified.  Significant lymphoid aggregates are not seen.  Immunohistochemical stain for CD138 and in situ hybridization for kappa and lambda were performed on block C1 with appropriate controls.  CD138 highlights the plasma cell component consisting of interstitial cells and small clusters and displays kappa light chain restriction.  IRON STAIN: Iron stains are performed on a bone marrow aspirate or touch imprint smear and section of clot. The controls stained appropriately.       Storage Iron: Present      Ring Sideroblasts: Absent  ADDITIONAL DATA/TESTING: At the request of the clinician, the specimen will not be sent for cytogenetic analysis or FISH studies.      SCANS:  Her skeletal survey revealed the following: Narrative & Impression  CLINICAL DATA:  Back and hip pain, possible multiple myeloma   EXAM: METASTATIC BONE SURVEY- 21 views   COMPARISON:  None Available.   FINDINGS: Lower cervical spondylosis. Abdominal aortic atherosclerotic calcifications. Mild degenerative spurring of both hips and acetabula. Postoperative findings including ACL repair, left knee.   No definite lytic lesions to indicate myeloma.   IMPRESSION: 1. No definite lytic lesions to indicate myeloma. 2. Lower cervical spondylosis. 3. Mild degenerative spurring of both hips and acetabula. 4. Abdominal aortic atherosclerotic calcifications. 5. Postoperative findings  including ACL repair, left knee.   LABS:      Latest Ref Rng & Units 12/08/2023    8:48 AM 10/28/2023   10:10 AM 08/10/2014    1:21 PM  CBC  WBC 4.0 - 10.5 K/uL 5.4  4.9  6.1   Hemoglobin 12.0 - 15.0 g/dL 16.1  09.6  04.5    40.9   Hematocrit 36.0 - 46.0 % 40.0  39.5  41.5   Platelets 150 - 400 K/uL 264  301  274       Latest Ref Rng & Units 10/28/2023   10:10 AM 08/10/2014    1:21 PM 08/09/2013   11:05 AM  CMP  Glucose 70 - 99 mg/dL 811  77  84   BUN 6 - 20 mg/dL 9  13  12    Creatinine 0.44 - 1.00 mg/dL 9.14  7.82  9.56   Sodium 135 - 145 mmol/L 138  141  140   Potassium 3.5 - 5.1 mmol/L 4.0  4.5  4.4   Chloride 98 - 111 mmol/L 102  104  103   CO2 22 - 32 mmol/L 27  24  28  Calcium 8.9 - 10.3 mg/dL 9.5  9.4  9.4   Total Protein 6.0 - 8.3 g/dL  6.9  7.0   Total Bilirubin 0.2 - 1.2 mg/dL  0.5  0.7   Alkaline Phos 39 - 117 U/L  48  46   AST 0 - 37 U/L  19  21   ALT 0 - 35 U/L  16  16     Latest Reference Range & Units 10/28/23 10:10  M-SPIKE, % Not Observed g/dL 2.1 (H)  (H): Data is abnormally high  Latest Reference Range & Units 10/28/23 10:11  IgG (Immunoglobin G), Serum 586 - 1,602 mg/dL 4,098 (H)  IgM (Immunoglobulin M), Srm 26 - 217 mg/dL 54  IgA 87 - 119 mg/dL 76 (L)  (H): Data is abnormally high (L): Data is abnormally low  Latest Reference Range & Units 10/28/23 10:10  Kappa free light chain 3.3 - 19.4 mg/L 15.2  Lambda free light chains 5.7 - 26.3 mg/L 9.9  Kappa, lambda light chain ratio 0.26 - 1.65  1.54    ASSESSMENT & PLAN  Assessment/Plan:  A 53 y.o. female whose most recent bone marrow biopsy, which showed 9% plasma cells, essentially qualifies her for having a monoclonal gammopathy of unknown significance (MGUS).  The patient is well aware that a bone marrow showing >=10% plasma cells would essentially qualify her as having multiple myeloma.  To date, there has been no strong data suggesting early, preemptive therapy with MGUS has proven to be  beneficial.  I am also comforted by the fact that her cytogenetics and myeloma FISH panel from her 1st bone marrow did not show any adverse findings.  She already understands that her younger age, in conjunction with the current 9% plasma cell reading in her bone marrow, increases her chances of developing multiple myeloma at some point in her life.  The patient was also understandably pleased to find out that her skeletal survey did not show any lytic bone lesions.  For now, this patient's MGUS will be followed conservatively.  Her monoclonal parameters will be checked every 6 months; if there is ever a brisk rise in any of these labs, the patient understands a repeat bone marrow biopsy would be done to ensure she has not transformed to multiple myeloma.  I will see this patient back in August 2025 for repeat clinical assessment.  The patient understands all the plans discussed today and is in agreement with them.    Ermal Haberer Kirby Funk, MD

## 2023-12-22 ENCOUNTER — Inpatient Hospital Stay: Payer: BC Managed Care – PPO | Attending: Oncology | Admitting: Oncology

## 2023-12-22 ENCOUNTER — Other Ambulatory Visit: Payer: Self-pay | Admitting: Oncology

## 2023-12-22 VITALS — BP 127/69 | HR 59 | Temp 98.1°F | Resp 14 | Ht 67.0 in | Wt 159.2 lb

## 2023-12-22 DIAGNOSIS — D472 Monoclonal gammopathy: Secondary | ICD-10-CM | POA: Diagnosis not present

## 2024-01-26 DIAGNOSIS — M5432 Sciatica, left side: Secondary | ICD-10-CM | POA: Diagnosis not present

## 2024-01-26 DIAGNOSIS — M47816 Spondylosis without myelopathy or radiculopathy, lumbar region: Secondary | ICD-10-CM | POA: Diagnosis not present

## 2024-02-17 DIAGNOSIS — M5416 Radiculopathy, lumbar region: Secondary | ICD-10-CM | POA: Diagnosis not present

## 2024-02-17 DIAGNOSIS — M545 Low back pain, unspecified: Secondary | ICD-10-CM | POA: Diagnosis not present

## 2024-03-08 DIAGNOSIS — M545 Low back pain, unspecified: Secondary | ICD-10-CM | POA: Diagnosis not present

## 2024-03-08 DIAGNOSIS — M5416 Radiculopathy, lumbar region: Secondary | ICD-10-CM | POA: Diagnosis not present

## 2024-03-15 DIAGNOSIS — M545 Low back pain, unspecified: Secondary | ICD-10-CM | POA: Diagnosis not present

## 2024-03-15 DIAGNOSIS — M5416 Radiculopathy, lumbar region: Secondary | ICD-10-CM | POA: Diagnosis not present

## 2024-03-22 DIAGNOSIS — M5416 Radiculopathy, lumbar region: Secondary | ICD-10-CM | POA: Diagnosis not present

## 2024-03-22 DIAGNOSIS — M545 Low back pain, unspecified: Secondary | ICD-10-CM | POA: Diagnosis not present

## 2024-03-29 DIAGNOSIS — M5416 Radiculopathy, lumbar region: Secondary | ICD-10-CM | POA: Diagnosis not present

## 2024-03-29 DIAGNOSIS — M545 Low back pain, unspecified: Secondary | ICD-10-CM | POA: Diagnosis not present

## 2024-06-15 ENCOUNTER — Inpatient Hospital Stay: Payer: BC Managed Care – PPO | Attending: Oncology

## 2024-06-15 DIAGNOSIS — D472 Monoclonal gammopathy: Secondary | ICD-10-CM | POA: Insufficient documentation

## 2024-06-15 LAB — CMP (CANCER CENTER ONLY)
ALT: 39 U/L (ref 0–44)
AST: 32 U/L (ref 15–41)
Albumin: 4.5 g/dL (ref 3.5–5.0)
Alkaline Phosphatase: 63 U/L (ref 38–126)
Anion gap: 11 (ref 5–15)
BUN: 7 mg/dL (ref 6–20)
CO2: 24 mmol/L (ref 22–32)
Calcium: 9.6 mg/dL (ref 8.9–10.3)
Chloride: 104 mmol/L (ref 98–111)
Creatinine: 0.8 mg/dL (ref 0.44–1.00)
GFR, Estimated: >60 mL/min (ref >60)
Glucose, Bld: 110 mg/dL — ABNORMAL HIGH (ref 70–99)
Potassium: 4 mmol/L (ref 3.5–5.1)
Sodium: 139 mmol/L (ref 135–145)
Total Bilirubin: 0.6 mg/dL (ref 0.0–1.2)
Total Protein: 9.4 g/dL — ABNORMAL HIGH (ref 6.5–8.1)

## 2024-06-15 LAB — CBC WITH DIFFERENTIAL (CANCER CENTER ONLY)
Abs Immature Granulocytes: 0.01 K/uL (ref 0.00–0.07)
Basophils Absolute: 0 K/uL (ref 0.0–0.1)
Basophils Relative: 1 %
Eosinophils Absolute: 0.2 K/uL (ref 0.0–0.5)
Eosinophils Relative: 4 %
HCT: 40.5 % (ref 36.0–46.0)
Hemoglobin: 13.3 g/dL (ref 12.0–15.0)
Immature Granulocytes: 0 %
Lymphocytes Relative: 37 %
Lymphs Abs: 1.8 K/uL (ref 0.7–4.0)
MCH: 28.7 pg (ref 26.0–34.0)
MCHC: 32.8 g/dL (ref 30.0–36.0)
MCV: 87.5 fL (ref 80.0–100.0)
Monocytes Absolute: 0.3 K/uL (ref 0.1–1.0)
Monocytes Relative: 6 %
Neutro Abs: 2.4 K/uL (ref 1.7–7.7)
Neutrophils Relative %: 52 %
Platelet Count: 287 K/uL (ref 150–400)
RBC: 4.63 MIL/uL (ref 3.87–5.11)
RDW: 13.6 % (ref 11.5–15.5)
WBC Count: 4.7 K/uL (ref 4.0–10.5)
nRBC: 0 % (ref 0.0–0.2)

## 2024-06-16 LAB — KAPPA/LAMBDA LIGHT CHAINS
Kappa free light chain: 14.9 mg/L (ref 3.3–19.4)
Kappa, lambda light chain ratio: 1.6 (ref 0.26–1.65)
Lambda free light chains: 9.3 mg/L (ref 5.7–26.3)

## 2024-06-18 LAB — MULTIPLE MYELOMA PANEL, SERUM
Albumin SerPl Elph-Mcnc: 4.2 g/dL (ref 2.9–4.4)
Albumin/Glob SerPl: 0.9 (ref 0.7–1.7)
Alpha 1: 0.2 g/dL (ref 0.0–0.4)
Alpha2 Glob SerPl Elph-Mcnc: 0.7 g/dL (ref 0.4–1.0)
B-Globulin SerPl Elph-Mcnc: 1 g/dL (ref 0.7–1.3)
Gamma Glob SerPl Elph-Mcnc: 2.7 g/dL — ABNORMAL HIGH (ref 0.4–1.8)
Globulin, Total: 4.7 g/dL — ABNORMAL HIGH (ref 2.2–3.9)
IgA: 65 mg/dL — ABNORMAL LOW (ref 87–352)
IgG (Immunoglobin G), Serum: 3259 mg/dL — ABNORMAL HIGH (ref 586–1602)
IgM (Immunoglobulin M), Srm: 49 mg/dL (ref 26–217)
M Protein SerPl Elph-Mcnc: 2.3 g/dL — ABNORMAL HIGH
Total Protein ELP: 8.9 g/dL — ABNORMAL HIGH (ref 6.0–8.5)

## 2024-06-20 NOTE — Progress Notes (Unsigned)
 [facility] at [facility] [address] [place],  [place]  [address] [phone]  Clinic Day:  06/21/2024  Referring physician: Jefferey Fitch, MD  HISTORY OF PRESENT ILLNESS:  The patient is a 53 y.o. female with MGUS.  A second bone marrow biopsy done in February 2025 showed her with 9% plasma cells, which has her on the precipice of evolving into smoldering multiple myeloma. A skeletal survey earlier this year showed no lytic lesions.  She comes in today for routine follow-up.  Since her last visit, the patient has been doing fairly well.  She claims her lower back pain has gotten much better since she has being doing strength exercises.  She denies having any new symptoms/findings which concern her for multiple myeloma overtly being present.  PHYSICAL EXAM:  Blood pressure 128/69, pulse (!) 56, temperature 98.6 F (37 C), temperature source Oral, resp. rate 16, height 5' 7 (1.702 m), weight 157 lb 1.6 oz (71.3 kg), SpO2 100%. Wt Readings from Last 3 Encounters:  06/21/24 157 lb 1.6 oz (71.3 kg)  12/22/23 159 lb 3.2 oz (72.2 kg)  12/08/23 155 lb (70.3 kg)   Body mass index is 24.61 kg/m. Performance status (ECOG): 1 - Symptomatic but completely ambulatory Physical Exam Constitutional:      Appearance: Normal appearance. She is not ill-appearing.  HENT:     Mouth/Throat:     Mouth: Mucous membranes are moist.     Pharynx: Oropharynx is clear. No oropharyngeal exudate or posterior oropharyngeal erythema.  Cardiovascular:     Rate and Rhythm: Normal rate and regular rhythm.     Heart sounds: No murmur heard.    No friction rub. No gallop.  Pulmonary:     Effort: Pulmonary effort is normal. No respiratory distress.     Breath sounds: Normal breath sounds. No wheezing, rhonchi or rales.  Abdominal:     General: Bowel sounds are normal. There is no distension.     Palpations: Abdomen is soft. There is no mass.     Tenderness: There is no abdominal  tenderness.  Musculoskeletal:        General: No swelling.     Right lower leg: No edema.     Left lower leg: No edema.  Lymphadenopathy:     Cervical: No cervical adenopathy.     Upper Body:     Right upper body: No supraclavicular or axillary adenopathy.     Left upper body: No supraclavicular or axillary adenopathy.     Lower Body: No right inguinal adenopathy. No left inguinal adenopathy.  Skin:    General: Skin is warm.     Coloration: Skin is not jaundiced.     Findings: No lesion or rash.  Neurological:     General: No focal deficit present.     Mental Status: She is alert and oriented to person, place, and time. Mental status is at baseline.  Psychiatric:        Mood and Affect: Mood normal.        Behavior: Behavior normal.        Thought Content: Thought content normal.    LABS:      Latest Ref Rng & Units 06/15/2024   10:03 AM 12/08/2023    8:48 AM 10/28/2023   10:10 AM  CBC  WBC 4.0 - 10.5 K/uL 4.7  5.4  4.9   Hemoglobin 12.0 - 15.0 g/dL 86.6  86.3  86.5   Hematocrit 36.0 - 46.0 % 40.5  40.0  39.5   Platelets 150 - 400 K/uL 287  264  301       Latest Ref Rng & Units 06/15/2024   10:03 AM 10/28/2023   10:10 AM 08/10/2014    1:21 PM  CMP  Glucose 70 - 99 mg/dL 889  891  77   BUN 6 - 20 mg/dL 7  9  13    Creatinine 0.44 - 1.00 mg/dL 9.19  9.18  9.21   Sodium 135 - 145 mmol/L 139  138  141   Potassium 3.5 - 5.1 mmol/L 4.0  4.0  4.5   Chloride 98 - 111 mmol/L 104  102  104   CO2 22 - 32 mmol/L 24  27  24    Calcium 8.9 - 10.3 mg/dL 9.6  9.5  9.4   Total Protein 6.5 - 8.1 g/dL 9.4   6.9   Total Bilirubin 0.0 - 1.2 mg/dL 0.6   0.5   Alkaline Phos 38 - 126 U/L 63   48   AST 15 - 41 U/L 32   19   ALT 0 - 44 U/L 39   16     Latest Reference Range & Units 06/15/24 10:03  M Protein SerPl Elph-Mcnc Not Observed g/dL 2.3 (H) (C)  (H): Data is abnormally high (C): Corrected  Latest Reference Range & Units 06/15/24 10:03  IgG (Immunoglobin G), Serum 586 - 1,602 mg/dL  6,740 (H)  IgM (Immunoglobulin M), Srm 26 - 217 mg/dL 49  IgA 87 - 647 mg/dL 65 (L)  (H): Data is abnormally high (L): Data is abnormally low  Latest Reference Range & Units 06/15/24 10:03  Kappa free light chain 3.3 - 19.4 mg/L 14.9  Lambda free light chains 5.7 - 26.3 mg/L 9.3  Kappa, lambda light chain ratio 0.26 - 1.65  1.60    ASSESSMENT & PLAN  Assessment/Plan:  A 53 y.o. female with MGUS, whose most recent bone marrow biopsy in January 2025 showed 9% plasma cells.  When evaluating her most recent myeloma parameters, they have essentially held stable to where it does not appear her MGUS has transformed into multiple myeloma.  I am further comforted by the fact that her blood counts, calcium, and renal function all remain normal.  Clinically, this patient appears to be doing well.  At that is the case, I will see her back in another 6 months for repeat clinical assessment. The patient understands all the plans discussed today and is in agreement with them.    Jiovanny Burdell DELENA Kerns, MD

## 2024-06-21 ENCOUNTER — Other Ambulatory Visit: Payer: Self-pay

## 2024-06-21 ENCOUNTER — Other Ambulatory Visit: Payer: Self-pay | Admitting: Oncology

## 2024-06-21 ENCOUNTER — Inpatient Hospital Stay (HOSPITAL_BASED_OUTPATIENT_CLINIC_OR_DEPARTMENT_OTHER): Admitting: Oncology

## 2024-06-21 ENCOUNTER — Telehealth: Payer: Self-pay | Admitting: Oncology

## 2024-06-21 VITALS — BP 128/69 | HR 56 | Temp 98.6°F | Resp 16 | Ht 67.0 in | Wt 157.1 lb

## 2024-06-21 DIAGNOSIS — D472 Monoclonal gammopathy: Secondary | ICD-10-CM

## 2024-06-21 NOTE — Telephone Encounter (Signed)
 Patient has been scheduled for follow-up visit per 06/21/24 LOS.  Pt aware of scheduled appt details.

## 2024-06-22 ENCOUNTER — Ambulatory Visit: Payer: BC Managed Care – PPO | Admitting: Oncology

## 2024-07-28 DIAGNOSIS — E7800 Pure hypercholesterolemia, unspecified: Secondary | ICD-10-CM | POA: Diagnosis not present

## 2024-07-28 DIAGNOSIS — Z0000 Encounter for general adult medical examination without abnormal findings: Secondary | ICD-10-CM | POA: Diagnosis not present

## 2024-07-28 DIAGNOSIS — E538 Deficiency of other specified B group vitamins: Secondary | ICD-10-CM | POA: Diagnosis not present

## 2024-07-28 DIAGNOSIS — Z6825 Body mass index (BMI) 25.0-25.9, adult: Secondary | ICD-10-CM | POA: Diagnosis not present

## 2024-07-28 DIAGNOSIS — Z1331 Encounter for screening for depression: Secondary | ICD-10-CM | POA: Diagnosis not present

## 2024-12-15 ENCOUNTER — Inpatient Hospital Stay

## 2024-12-15 DIAGNOSIS — D472 Monoclonal gammopathy: Secondary | ICD-10-CM

## 2024-12-15 LAB — CMP (CANCER CENTER ONLY)
ALT: 24 U/L (ref 0–44)
AST: 28 U/L (ref 15–41)
Albumin: 4.5 g/dL (ref 3.5–5.0)
Alkaline Phosphatase: 61 U/L (ref 38–126)
Anion gap: 11 (ref 5–15)
BUN: 11 mg/dL (ref 6–20)
CO2: 27 mmol/L (ref 22–32)
Calcium: 10.2 mg/dL (ref 8.9–10.3)
Chloride: 99 mmol/L (ref 98–111)
Creatinine: 0.83 mg/dL (ref 0.44–1.00)
GFR, Estimated: >60 mL/min
Glucose, Bld: 107 mg/dL — ABNORMAL HIGH (ref 70–99)
Potassium: 4.2 mmol/L (ref 3.5–5.1)
Sodium: 137 mmol/L (ref 135–145)
Total Bilirubin: 0.7 mg/dL (ref 0.0–1.2)
Total Protein: 9.5 g/dL — ABNORMAL HIGH (ref 6.5–8.1)

## 2024-12-15 LAB — CBC WITH DIFFERENTIAL (CANCER CENTER ONLY)
Abs Immature Granulocytes: 0.02 10*3/uL (ref 0.00–0.07)
Basophils Absolute: 0 10*3/uL (ref 0.0–0.1)
Basophils Relative: 1 %
Eosinophils Absolute: 0.2 10*3/uL (ref 0.0–0.5)
Eosinophils Relative: 3 %
HCT: 40.5 % (ref 36.0–46.0)
Hemoglobin: 13.6 g/dL (ref 12.0–15.0)
Immature Granulocytes: 0 %
Lymphocytes Relative: 36 %
Lymphs Abs: 2 10*3/uL (ref 0.7–4.0)
MCH: 28.8 pg (ref 26.0–34.0)
MCHC: 33.6 g/dL (ref 30.0–36.0)
MCV: 85.6 fL (ref 80.0–100.0)
Monocytes Absolute: 0.3 10*3/uL (ref 0.1–1.0)
Monocytes Relative: 5 %
Neutro Abs: 3.2 10*3/uL (ref 1.7–7.7)
Neutrophils Relative %: 55 %
Platelet Count: 313 10*3/uL (ref 150–400)
RBC: 4.73 MIL/uL (ref 3.87–5.11)
RDW: 12.9 % (ref 11.5–15.5)
WBC Count: 5.8 10*3/uL (ref 4.0–10.5)
nRBC: 0 % (ref 0.0–0.2)

## 2024-12-16 LAB — KAPPA/LAMBDA LIGHT CHAINS
Kappa free light chain: 16.2 mg/L (ref 3.3–19.4)
Kappa, lambda light chain ratio: 1.59 (ref 0.26–1.65)
Lambda free light chains: 10.2 mg/L (ref 5.7–26.3)

## 2024-12-17 LAB — MULTIPLE MYELOMA PANEL, SERUM
Albumin SerPl Elph-Mcnc: 4.3 g/dL (ref 2.9–4.4)
Albumin/Glob SerPl: 0.9 (ref 0.7–1.7)
Alpha 1: 0.2 g/dL (ref 0.0–0.4)
Alpha2 Glob SerPl Elph-Mcnc: 0.8 g/dL (ref 0.4–1.0)
B-Globulin SerPl Elph-Mcnc: 1 g/dL (ref 0.7–1.3)
Gamma Glob SerPl Elph-Mcnc: 2.9 g/dL — ABNORMAL HIGH (ref 0.4–1.8)
Globulin, Total: 4.9 g/dL — ABNORMAL HIGH (ref 2.2–3.9)
IgA: 71 mg/dL — ABNORMAL LOW (ref 87–352)
IgG (Immunoglobin G), Serum: 3724 mg/dL — ABNORMAL HIGH (ref 586–1602)
IgM (Immunoglobulin M), Srm: 58 mg/dL (ref 26–217)
M Protein SerPl Elph-Mcnc: 2.6 g/dL — ABNORMAL HIGH
Total Protein ELP: 9.2 g/dL — ABNORMAL HIGH (ref 6.0–8.5)

## 2024-12-22 ENCOUNTER — Inpatient Hospital Stay: Admitting: Oncology
